# Patient Record
Sex: Male | Born: 1985 | Race: Black or African American | Hispanic: No | Marital: Married | Smoking: Never smoker
Health system: Southern US, Community
[De-identification: ages and names within clinical notes are randomized; demographics above are authoritative.]

---

## 2019-09-13 ENCOUNTER — Emergency Department (HOSPITAL_COMMUNITY)
Admission: EM | Admit: 2019-09-13 | Discharge: 2019-09-14 | Disposition: A | Discharge status: Home / Self Care | Payer: Self-pay | Attending: Emergency Medicine | Admitting: Emergency Medicine

## 2019-09-13 ENCOUNTER — Encounter (HOSPITAL_COMMUNITY): Payer: Self-pay

## 2019-09-13 ENCOUNTER — Other Ambulatory Visit: Payer: Self-pay

## 2019-09-13 DIAGNOSIS — Y929 Unspecified place or not applicable: Secondary | ICD-10-CM | POA: Insufficient documentation

## 2019-09-13 DIAGNOSIS — Y939 Activity, unspecified: Secondary | ICD-10-CM | POA: Insufficient documentation

## 2019-09-13 DIAGNOSIS — S0083XA Contusion of other part of head, initial encounter: Secondary | ICD-10-CM | POA: Insufficient documentation

## 2019-09-13 DIAGNOSIS — Y999 Unspecified external cause status: Secondary | ICD-10-CM | POA: Insufficient documentation
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
## 2019-09-14 ENCOUNTER — Emergency Department (HOSPITAL_COMMUNITY): Payer: Self-pay

## 2019-09-14 MED ORDER — IBUPROFEN 800 MG PO TABS
800.0000 mg | ORAL_TABLET | Freq: Once | ORAL | Status: DC
  Filled 2019-09-14: qty 1
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
## 2019-09-14 NOTE — Discharge Instructions (Addendum)
1. Medications: ibuprofen for pain, usual home medications 2. Treatment: rest, drink plenty of fluids,  3. Follow Up: Please followup with your primary doctor in 2-3 days for discussion of your diagnoses and further evaluation after today's visit; if you do not have a primary care doctor use the resource guide provided to find one; Please return to the ER for new or worsening symptoms.

## 2019-10-18 ENCOUNTER — Encounter (HOSPITAL_COMMUNITY): Payer: Self-pay | Admitting: Emergency Medicine

## 2019-10-18 ENCOUNTER — Inpatient Hospital Stay (HOSPITAL_COMMUNITY)
Admission: EM | Admit: 2019-10-18 | Discharge: 2019-10-21 | DRG: 917 | Disposition: A | Discharge status: Home / Self Care | Payer: Self-pay | Attending: Internal Medicine | Admitting: Internal Medicine

## 2019-10-18 ENCOUNTER — Other Ambulatory Visit: Payer: Self-pay

## 2019-10-18 ENCOUNTER — Emergency Department (HOSPITAL_COMMUNITY): Payer: Self-pay

## 2019-10-18 DIAGNOSIS — F1490 Cocaine use, unspecified, uncomplicated: Secondary | ICD-10-CM | POA: Diagnosis present

## 2019-10-18 DIAGNOSIS — T50901A Poisoning by unspecified drugs, medicaments and biological substances, accidental (unintentional), initial encounter: Principal | ICD-10-CM | POA: Diagnosis present

## 2019-10-18 DIAGNOSIS — N179 Acute kidney failure, unspecified: Secondary | ICD-10-CM | POA: Diagnosis present

## 2019-10-18 DIAGNOSIS — J9600 Acute respiratory failure, unspecified whether with hypoxia or hypercapnia: Secondary | ICD-10-CM | POA: Diagnosis present

## 2019-10-18 DIAGNOSIS — F10120 Alcohol abuse with intoxication, uncomplicated: Secondary | ICD-10-CM | POA: Diagnosis present

## 2019-10-18 DIAGNOSIS — F10920 Alcohol use, unspecified with intoxication, uncomplicated: Secondary | ICD-10-CM | POA: Diagnosis present

## 2019-10-18 DIAGNOSIS — R4182 Altered mental status, unspecified: Secondary | ICD-10-CM

## 2019-10-18 DIAGNOSIS — Y906 Blood alcohol level of 120-199 mg/100 ml: Secondary | ICD-10-CM | POA: Diagnosis present

## 2019-10-18 DIAGNOSIS — J9601 Acute respiratory failure with hypoxia: Secondary | ICD-10-CM | POA: Diagnosis present

## 2019-10-18 DIAGNOSIS — R519 Headache, unspecified: Secondary | ICD-10-CM | POA: Diagnosis not present

## 2019-10-18 DIAGNOSIS — Z20822 Contact with and (suspected) exposure to covid-19: Secondary | ICD-10-CM | POA: Diagnosis present

## 2019-10-18 DIAGNOSIS — R0902 Hypoxemia: Secondary | ICD-10-CM

## 2019-10-18 LAB — CBC WITH DIFFERENTIAL/PLATELET
Abs Immature Granulocytes: 0.36 10*3/uL — ABNORMAL HIGH (ref 0.00–0.07)
Basophils Absolute: 0.1 10*3/uL (ref 0.0–0.1)
Basophils Relative: 0 %
Eosinophils Absolute: 0 10*3/uL (ref 0.0–0.5)
Eosinophils Relative: 0 %
HCT: 45.1 % (ref 39.0–52.0)
Hemoglobin: 14.3 g/dL (ref 13.0–17.0)
Immature Granulocytes: 2 %
Lymphocytes Relative: 8 %
Lymphs Abs: 1.3 10*3/uL (ref 0.7–4.0)
MCH: 32.1 pg (ref 26.0–34.0)
MCHC: 31.7 g/dL (ref 30.0–36.0)
MCV: 101.1 fL — ABNORMAL HIGH (ref 80.0–100.0)
Monocytes Absolute: 0.6 10*3/uL (ref 0.1–1.0)
Monocytes Relative: 4 %
Neutro Abs: 13.9 10*3/uL — ABNORMAL HIGH (ref 1.7–7.7)
Neutrophils Relative %: 86 %
Platelets: 214 10*3/uL (ref 150–400)
RBC: 4.46 MIL/uL (ref 4.22–5.81)
RDW: 14.6 % (ref 11.5–15.5)
WBC: 16.1 10*3/uL — ABNORMAL HIGH (ref 4.0–10.5)
nRBC: 0 % (ref 0.0–0.2)

## 2019-10-18 LAB — BASIC METABOLIC PANEL
Anion gap: 20 — ABNORMAL HIGH (ref 5–15)
BUN: 11 mg/dL (ref 6–20)
CO2: 16 mmol/L — ABNORMAL LOW (ref 22–32)
Calcium: 8.7 mg/dL — ABNORMAL LOW (ref 8.9–10.3)
Chloride: 103 mmol/L (ref 98–111)
Creatinine, Ser: 1.88 mg/dL — ABNORMAL HIGH (ref 0.61–1.24)
GFR calc Af Amer: 53 mL/min — ABNORMAL LOW (ref >60)
GFR calc non Af Amer: 46 mL/min — ABNORMAL LOW (ref >60)
Glucose, Bld: 193 mg/dL — ABNORMAL HIGH (ref 70–99)
Potassium: 3.8 mmol/L (ref 3.5–5.1)
Sodium: 139 mmol/L (ref 135–145)

## 2019-10-18 LAB — ETHANOL: Alcohol, Ethyl (B): 179 mg/dL — ABNORMAL HIGH (ref <10)

## 2019-10-18 MED ORDER — SODIUM CHLORIDE 0.9 % IV BOLUS
1000.0000 mL | Freq: Once | INTRAVENOUS | Status: AC
  Administered 2019-10-18: 1000 mL via INTRAVENOUS
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
## 2019-10-18 NOTE — ED Provider Notes (Signed)
[facility]   CSN: [id number] Arrival date & time: 10/18/19  1854     History Chief Complaint  Patient presents with  . Drug Overdose  . Altered Mental Status    Reinaldo Raddleerrance Goines is a 34 y.o. male.  Level 5 caveat secondary to altered mental status.  He was brought in by EMS after becoming unresponsive while in the bathroom at a friend's house.  Possible seizure activity.  EMS found him with a good blood sugar.  Small pupils and low respiratory rate so gave 2 mg of Narcan twice with improvement in mental status.  Patient still somnolent here.  Not answering questions.  The history is provided by the patient and the EMS personnel. The history is limited by the condition of the patient.  Altered Mental Status Presenting symptoms: partial responsiveness   Severity:  Moderate Most recent episode:  Today Episode history:  Single Timing:  Unable to specify Progression:  Partially resolved Chronicity:  New Context: drug use (?)        No past medical history on file.  There are no problems to display for this patient.   History reviewed. No pertinent surgical history.     No family history on file.  Social History   Tobacco Use  . Smoking status: Never Smoker  . Smokeless tobacco: Never Used  Substance Use Topics  . Alcohol use: Yes  . Drug use: Yes    Types: Cocaine    Home Medications Prior to Admission medications   Not on File    Allergies    Patient has no known allergies.  Review of Systems   Review of Systems  Unable to perform ROS: Mental status change    Physical Exam Updated Vital Signs BP 137/86 (BP Location: Left Arm)   Pulse (!) 108   Temp 97.9 F (36.6 C) (Oral)   Resp 17   SpO2 98%   Physical Exam Vitals and nursing note reviewed.  Constitutional:      General: He is not in acute distress.    Appearance: Normal appearance. He is well-developed.  HENT:     Head: Normocephalic  and atraumatic.  Eyes:     Conjunctiva/sclera: Conjunctivae normal.  Neck:     Comments: In cervical collar trach midline Cardiovascular:     Rate and Rhythm: Normal rate and regular rhythm.     Heart sounds: No murmur.  Pulmonary:     Effort: Pulmonary effort is normal. No respiratory distress.     Breath sounds: Normal breath sounds.  Abdominal:     Palpations: Abdomen is soft.     Tenderness: There is no abdominal tenderness.  Musculoskeletal:        General: No deformity or signs of injury. Normal range of motion.  Skin:    General: Skin is warm and dry.     Capillary Refill: Capillary refill takes less than 2 seconds.  Neurological:     Mental Status: He is lethargic.     Comments: Patient is arousable to painful stimuli.  Opens eyes.  Not answering questions.  Not following commands.  Moving all extremities to painful stimuli.  GCS currently 8     ED Results / Procedures / Treatments   Labs (all labs ordered are listed, but only abnormal results are displayed) Labs Reviewed  BASIC METABOLIC PANEL - Abnormal; Notable for the following components:      Result Value   CO2 16 (*)  Glucose, Bld 193 (*)    Creatinine, Ser 1.88 (*)    Calcium 8.7 (*)    GFR calc non Af Amer 46 (*)    GFR calc Af Amer 53 (*)    Anion gap 20 (*)    All other components within normal limits  CBC WITH DIFFERENTIAL/PLATELET - Abnormal; Notable for the following components:   WBC 16.1 (*)    MCV 101.1 (*)    Neutro Abs 13.9 (*)    Abs Immature Granulocytes 0.36 (*)    All other components within normal limits  ETHANOL - Abnormal; Notable for the following components:   Alcohol, Ethyl (B) 179 (*)    All other components within normal limits  RAPID URINE DRUG SCREEN, HOSP PERFORMED - Abnormal; Notable for the following components:   Cocaine POSITIVE (*)    All other components within normal limits  D-DIMER, QUANTITATIVE (NOT AT [facility]) - Abnormal; Notable for the following components:    D-Dimer, Quant 0.51 (*)    All other components within normal limits  URINALYSIS, COMPLETE (UACMP) WITH MICROSCOPIC - Abnormal; Notable for the following components:   Ketones, ur 20 (*)    Protein, ur 30 (*)    All other components within normal limits  POCT I-STAT 7, (LYTES, BLD GAS, ICA,H+H) - Abnormal; Notable for the following components:   pH, Arterial 7.341 (*)    pO2, Arterial 62 (*)    All other components within normal limits  SARS CORONAVIRUS 2 BY RT PCR (HOSPITAL ORDER, PERFORMED IN [facility])  CULTURE, BLOOD (ROUTINE X 2)  CULTURE, BLOOD (ROUTINE X 2)  STREP PNEUMONIAE URINARY ANTIGEN  CK  HIV ANTIBODY (ROUTINE TESTING W REFLEX)  LEGIONELLA PNEUMOPHILA SEROGP 1 UR AG  I-STAT ARTERIAL BLOOD GAS, ED    EKG EKG Interpretation  Date/Time:  Wednesday Oct 18 2019 19:02:04 EDT Ventricular Rate:  112 PR Interval:    QRS Duration: 98 QT Interval:  359 QTC Calculation: 490 R Axis:   92 Text Interpretation: Sinus tachycardia Biatrial enlargement Borderline right axis deviation Borderline T wave abnormalities Prolonged QT interval No old tracing to compare Confirmed by Meridee Score [phone]) on 10/18/2019 7:04:17 PM Also confirmed by Meridee Score [phone]), editor Erenest Rasher ([id number])  on 10/19/2019 7:57:41 AM   Radiology CT Head Wo Contrast  Result Date: 10/18/2019 CLINICAL DATA:  Suspected drug overdose. Found unconscious. EXAM: CT HEAD WITHOUT CONTRAST CT CERVICAL SPINE WITHOUT CONTRAST TECHNIQUE: Multidetector CT imaging of the head and cervical spine was performed following the standard protocol without intravenous contrast. Multiplanar CT image reconstructions of the cervical spine were also generated. COMPARISON:  None. FINDINGS: CT HEAD FINDINGS Brain: There is no mass, hemorrhage or extra-axial collection. The size and configuration of the ventricles and extra-axial CSF spaces are normal. The brain parenchyma is normal, without evidence of acute or chronic  infarction. Vascular: No abnormal hyperdensity of the major intracranial arteries or dural venous sinuses. No intracranial atherosclerosis. Skull: The visualized skull base, calvarium and extracranial soft tissues are normal. Sinuses/Orbits: No fluid levels or advanced mucosal thickening of the visualized paranasal sinuses. No mastoid or middle ear effusion. The orbits are normal. CT CERVICAL SPINE FINDINGS Alignment: No static subluxation. Facets are aligned. Occipital condyles are normally positioned. Skull base and vertebrae: No acute fracture. C5-6 fusion. Soft tissues and spinal canal: No prevertebral fluid or swelling. No visible canal hematoma. Disc levels: No advanced spinal canal or neural foraminal stenosis. Upper chest: No pneumothorax, pulmonary nodule or pleural  effusion. Other: Normal visualized paraspinal cervical soft tissues. IMPRESSION: 1. No acute intracranial abnormality. 2. No acute fracture or static subluxation of the cervical spine. Electronically Signed   By: Ulyses Jarred M.D.   On: 10/18/2019 21:08   CT ANGIO CHEST PE W OR WO CONTRAST  Result Date: 10/19/2019 CLINICAL DATA:  Shortness of breath and lethargy EXAM: CT ANGIOGRAPHY CHEST WITH CONTRAST TECHNIQUE: Multidetector CT imaging of the chest was performed using the standard protocol during bolus administration of intravenous contrast. Multiplanar CT image reconstructions and MIPs were obtained to evaluate the vascular anatomy. CONTRAST:  85mL OMNIPAQUE IOHEXOL 350 MG/ML SOLN COMPARISON:  Chest radiograph Oct 18, 2019 FINDINGS: Cardiovascular: There is no demonstrable pulmonary embolus. There is no thoracic aortic aneurysm or dissection. Visualized great vessels appear normal. No appreciable pericardial effusion or pericardial thickening. Mediastinum/Nodes: Thyroid appears unremarkable. There is no appreciable thoracic adenopathy. Moderate air is noted in the esophagus. No focal esophageal lesions are evident by CT. Lungs/Pleura:  There is airspace opacity with consolidation throughout much of the left lower lobe. More patchy infiltrate is noted in portions of the posterior and superior segments of the right lower lobe. There is ill-defined patchy opacity in the posterior segment right upper lobe. No appreciable pleural effusions are evident. Upper Abdomen: Visualized upper abdominal structures appear unremarkable. Musculoskeletal: No blastic or lytic bone lesions. No chest wall lesions evident. Review of the MIP images confirms the above findings. IMPRESSION: 1. No demonstrable pulmonary embolus. No thoracic aortic aneurysm or dissection. 2. Multifocal pneumonia with the greatest degree of airspace opacity in the left lower lobe. Areas of airspace opacity also noted in the right lower lobe and posterior segment right upper lobe. Correlation with COVID-19 status may be advisable given these findings. 3.  No evident adenopathy. Electronically Signed   By: Lowella Grip III M.D.   On: 10/19/2019 07:59   CT Cervical Spine Wo Contrast  Result Date: 10/18/2019 CLINICAL DATA:  Suspected drug overdose. Found unconscious. EXAM: CT HEAD WITHOUT CONTRAST CT CERVICAL SPINE WITHOUT CONTRAST TECHNIQUE: Multidetector CT imaging of the head and cervical spine was performed following the standard protocol without intravenous contrast. Multiplanar CT image reconstructions of the cervical spine were also generated. COMPARISON:  None. FINDINGS: CT HEAD FINDINGS Brain: There is no mass, hemorrhage or extra-axial collection. The size and configuration of the ventricles and extra-axial CSF spaces are normal. The brain parenchyma is normal, without evidence of acute or chronic infarction. Vascular: No abnormal hyperdensity of the major intracranial arteries or dural venous sinuses. No intracranial atherosclerosis. Skull: The visualized skull base, calvarium and extracranial soft tissues are normal. Sinuses/Orbits: No fluid levels or advanced mucosal  thickening of the visualized paranasal sinuses. No mastoid or middle ear effusion. The orbits are normal. CT CERVICAL SPINE FINDINGS Alignment: No static subluxation. Facets are aligned. Occipital condyles are normally positioned. Skull base and vertebrae: No acute fracture. C5-6 fusion. Soft tissues and spinal canal: No prevertebral fluid or swelling. No visible canal hematoma. Disc levels: No advanced spinal canal or neural foraminal stenosis. Upper chest: No pneumothorax, pulmonary nodule or pleural effusion. Other: Normal visualized paraspinal cervical soft tissues. IMPRESSION: 1. No acute intracranial abnormality. 2. No acute fracture or static subluxation of the cervical spine. Electronically Signed   By: Ulyses Jarred M.D.   On: 10/18/2019 21:08   DG Chest Port 1 View  Result Date: 10/18/2019 CLINICAL DATA:  Altered EXAM: PORTABLE CHEST 1 VIEW COMPARISON:  None. FINDINGS: The heart size and mediastinal contours are  within normal limits. Both lungs are clear. The visualized skeletal structures are unremarkable. IMPRESSION: No active disease. Electronically Signed   By: Jasmine Pang M.D.   On: 10/18/2019 19:21    Procedures .Critical Care Performed by: Terrilee Files, MD Authorized by: Terrilee Files, MD   Critical care provider statement:    Critical care time (minutes):  45   Critical care time was exclusive of:  Separately billable procedures and treating other patients   Critical care was necessary to treat or prevent imminent or life-threatening deterioration of the following conditions:  CNS failure or compromise and respiratory failure   Critical care was time spent personally by me on the following activities:  Evaluation of patient's response to treatment, examination of patient, ordering and performing treatments and interventions, ordering and review of laboratory studies, ordering and review of radiographic studies, pulse oximetry, re-evaluation of patient's condition,  obtaining history from patient or surrogate, review of old charts and development of treatment plan with patient or surrogate   I assumed direction of critical care for this patient from another provider in my specialty: no     (including critical care time)  Medications Ordered in ED Medications  lactated ringers bolus 1,000 mL (1,000 mLs Intravenous New Bag/Given 10/19/19 8676)    Followed by  lactated ringers infusion (has no administration in time range)  cefTRIAXone (ROCEPHIN) 2 g in sodium chloride 0.9 % 100 mL IVPB (0 g Intravenous Stopped 10/19/19 0701)  azithromycin (ZITHROMAX) 500 mg in sodium chloride 0.9 % 250 mL IVPB (0 mg Intravenous Stopped 10/19/19 0731)  polyethylene glycol (MIRALAX / GLYCOLAX) packet 17 g (has no administration in time range)  ondansetron (ZOFRAN) tablet 4 mg (has no administration in time range)    Or  ondansetron (ZOFRAN) injection 4 mg (has no administration in time range)  enoxaparin (LOVENOX) injection 40 mg (has no administration in time range)  acetaminophen (TYLENOL) tablet 650 mg (has no administration in time range)    Or  acetaminophen (TYLENOL) suppository 650 mg (has no administration in time range)  sodium chloride 0.9 % bolus 1,000 mL (0 mLs Intravenous Stopped 10/19/19 0221)  iohexol (OMNIPAQUE) 350 MG/ML injection 60 mL (60 mLs Intravenous Contrast Given 10/19/19 1950)    ED Course  I have reviewed the triage vital signs and the nursing notes.  Pertinent labs & imaging results that were available during my care of the patient were reviewed by me and considered in my medical decision making (see chart for details).  Clinical Course as of Oct 18 924  Wed Oct 18, 2019  2020 Reevaluated patient, he is more easily arousable.  Still with some slurred speech and not answering questions.  Lab work showing elevated creatinine of 1.88 and elevated glucose with no priors to compare with.  Does have an anion gap.  Elevated white count of 16.   [MB]      Clinical Course User Index [MB] Terrilee Files, MD   MDM Rules/Calculators/A&P                     This patient complains of altered mental status; this involves an extensive number of treatment Options and is a complaint that carries with it a high risk of complications and Morbidity. The differential includes intoxication, seizure, bleed, metabolic derangement, infection  I ordered, reviewed and interpreted labs, which included CBC with elevated white count, chemistries with elevated creatinine and low bicarb likely reflecting some element of dehydration, unclear  baseline.  Covid testing negative I ordered medication IV fluids I ordered imaging studies which included chest x-ray head and cervical spine CT and I independently    visualized and interpreted imaging which showed no acute findings Additional history obtained from from EMS  Critical Interventions: Evaluation of altered mental status and serial neurologic exams  After the interventions stated above, I reevaluated the patient and found patient's mental status to be improving but clearly not back to baseline.  He is also requiring oxygen.  Desats off oxygen.  Chest x-ray negative.  Signed out to Dr. Wilkie Aye with plan to for serial neuro exams and if can wean off of oxygen back to baseline can be discharged.  Otherwise will need admission to the hospital for further management.   Final Clinical Impression(s) / ED Diagnoses Final diagnoses:  Altered mental status, unspecified altered mental status type  AKI (acute kidney injury) (HCC)  Hypoxia    Rx / DC Orders ED Discharge Orders    None       Terrilee Files, MD 10/19/19 [phone]

## 2019-10-18 NOTE — ED Notes (Signed)
Pt titrated to [place] at flow rate of 4 lpm, maintaining SPO2 97% at this time.

## 2019-10-18 NOTE — ED Triage Notes (Signed)
Patient arrives via ems from friends house after having a suspected overdose. Per ems, the patient's friend's heard a "thud" in the bathroom and found the patient unconscious in the floor. The people on the scene did not give much info to ems.   Patient was given 2mg  IV narcan en route and regained minimal consciousness. Patient became lethargic again and received another 2mg  IV narcan.   Patient arrives lethargic and only arousing to loud speech and sternal rubs.   20g IV to the R. AC

## 2019-10-18 NOTE — ED Notes (Signed)
Jon Gills sister [phone] looking for an update on pt

## 2019-10-19 ENCOUNTER — Observation Stay (HOSPITAL_COMMUNITY): Payer: Self-pay

## 2019-10-19 ENCOUNTER — Observation Stay (HOSPITAL_BASED_OUTPATIENT_CLINIC_OR_DEPARTMENT_OTHER): Payer: Self-pay

## 2019-10-19 ENCOUNTER — Encounter (HOSPITAL_COMMUNITY): Payer: Self-pay | Admitting: Internal Medicine

## 2019-10-19 DIAGNOSIS — R55 Syncope and collapse: Secondary | ICD-10-CM

## 2019-10-19 DIAGNOSIS — N179 Acute kidney failure, unspecified: Secondary | ICD-10-CM | POA: Diagnosis present

## 2019-10-19 DIAGNOSIS — F10920 Alcohol use, unspecified with intoxication, uncomplicated: Secondary | ICD-10-CM | POA: Diagnosis present

## 2019-10-19 DIAGNOSIS — J9601 Acute respiratory failure with hypoxia: Secondary | ICD-10-CM

## 2019-10-19 DIAGNOSIS — F1490 Cocaine use, unspecified, uncomplicated: Secondary | ICD-10-CM | POA: Diagnosis present

## 2019-10-19 LAB — CK: Total CK: 376 U/L (ref 49–397)

## 2019-10-19 LAB — COMPREHENSIVE METABOLIC PANEL
ALT: 49 U/L — ABNORMAL HIGH (ref 0–44)
AST: 66 U/L — ABNORMAL HIGH (ref 15–41)
Albumin: 4.3 g/dL (ref 3.5–5.0)
Alkaline Phosphatase: 63 U/L (ref 38–126)
Anion gap: 12 (ref 5–15)
BUN: 10 mg/dL (ref 6–20)
CO2: 23 mmol/L (ref 22–32)
Calcium: 8.6 mg/dL — ABNORMAL LOW (ref 8.9–10.3)
Chloride: 103 mmol/L (ref 98–111)
Creatinine, Ser: 0.9 mg/dL (ref 0.61–1.24)
GFR calc Af Amer: >60 mL/min (ref >60)
GFR calc non Af Amer: >60 mL/min (ref >60)
Glucose, Bld: 113 mg/dL — ABNORMAL HIGH (ref 70–99)
Potassium: 4.4 mmol/L (ref 3.5–5.1)
Sodium: 138 mmol/L (ref 135–145)
Total Bilirubin: 1.3 mg/dL — ABNORMAL HIGH (ref 0.3–1.2)
Total Protein: 7.3 g/dL (ref 6.5–8.1)

## 2019-10-19 LAB — RAPID URINE DRUG SCREEN, HOSP PERFORMED
Amphetamines: NOT DETECTED
Barbiturates: NOT DETECTED
Benzodiazepines: NOT DETECTED
Cocaine: POSITIVE — AB
Opiates: NOT DETECTED
Tetrahydrocannabinol: NOT DETECTED

## 2019-10-19 LAB — CBC
HCT: 40.2 % (ref 39.0–52.0)
Hemoglobin: 12.8 g/dL — ABNORMAL LOW (ref 13.0–17.0)
MCH: 32.1 pg (ref 26.0–34.0)
MCHC: 31.8 g/dL (ref 30.0–36.0)
MCV: 100.8 fL — ABNORMAL HIGH (ref 80.0–100.0)
Platelets: 220 10*3/uL (ref 150–400)
RBC: 3.99 MIL/uL — ABNORMAL LOW (ref 4.22–5.81)
RDW: 14.6 % (ref 11.5–15.5)
WBC: 17.9 10*3/uL — ABNORMAL HIGH (ref 4.0–10.5)
nRBC: 0 % (ref 0.0–0.2)

## 2019-10-19 LAB — URINALYSIS, COMPLETE (UACMP) WITH MICROSCOPIC
Bacteria, UA: NONE SEEN
Bilirubin Urine: NEGATIVE
Glucose, UA: NEGATIVE mg/dL
Hgb urine dipstick: NEGATIVE
Ketones, ur: 20 mg/dL — AB
Leukocytes,Ua: NEGATIVE
Nitrite: NEGATIVE
Protein, ur: 30 mg/dL — AB
Specific Gravity, Urine: 1.013 (ref 1.005–1.030)
pH: 5 (ref 5.0–8.0)

## 2019-10-19 LAB — PHOSPHORUS: Phosphorus: 3.1 mg/dL (ref 2.5–4.6)

## 2019-10-19 LAB — HIV ANTIBODY (ROUTINE TESTING W REFLEX): HIV Screen 4th Generation wRfx: NONREACTIVE

## 2019-10-19 LAB — POCT I-STAT 7, (LYTES, BLD GAS, ICA,H+H)
Acid-base deficit: 1 mmol/L (ref 0.0–2.0)
Bicarbonate: 25 mmol/L (ref 20.0–28.0)
Calcium, Ion: 1.19 mmol/L (ref 1.15–1.40)
HCT: 41 % (ref 39.0–52.0)
Hemoglobin: 13.9 g/dL (ref 13.0–17.0)
O2 Saturation: 90 %
Potassium: 4.4 mmol/L (ref 3.5–5.1)
Sodium: 135 mmol/L (ref 135–145)
TCO2: 26 mmol/L (ref 22–32)
pCO2 arterial: 46.2 mmHg (ref 32.0–48.0)
pH, Arterial: 7.341 — ABNORMAL LOW (ref 7.350–7.450)
pO2, Arterial: 62 mmHg — ABNORMAL LOW (ref 83.0–108.0)

## 2019-10-19 LAB — MAGNESIUM: Magnesium: 1.7 mg/dL (ref 1.7–2.4)

## 2019-10-19 LAB — ECHOCARDIOGRAM COMPLETE

## 2019-10-19 LAB — D-DIMER, QUANTITATIVE: D-Dimer, Quant: 0.51 ug/mL-FEU — ABNORMAL HIGH (ref 0.00–0.50)

## 2019-10-19 LAB — STREP PNEUMONIAE URINARY ANTIGEN: Strep Pneumo Urinary Antigen: NEGATIVE

## 2019-10-19 LAB — SARS CORONAVIRUS 2 BY RT PCR (HOSPITAL ORDER, PERFORMED IN ~~LOC~~ HOSPITAL LAB): SARS Coronavirus 2: NEGATIVE

## 2019-10-19 MED ORDER — THIAMINE HCL 100 MG PO TABS
100.0000 mg | ORAL_TABLET | Freq: Every day | ORAL | Status: DC
  Administered 2019-10-19 – 2019-10-21 (×3): 100 mg via ORAL
  Filled 2019-10-19 (×3): qty 1

## 2019-10-19 MED ORDER — LACTATED RINGERS IV SOLN: INTRAVENOUS | Status: AC

## 2019-10-19 MED ORDER — LORAZEPAM 2 MG/ML IJ SOLN: 1.0000 mg | INTRAMUSCULAR | Status: DC | PRN

## 2019-10-19 MED ORDER — FOLIC ACID 1 MG PO TABS
1.0000 mg | ORAL_TABLET | Freq: Every day | ORAL | Status: DC
  Administered 2019-10-19 – 2019-10-21 (×3): 1 mg via ORAL
  Filled 2019-10-19 (×3): qty 1

## 2019-10-19 MED ORDER — ONDANSETRON HCL 4 MG PO TABS: 4.0000 mg | ORAL_TABLET | Freq: Four times a day (QID) | ORAL | Status: DC | PRN

## 2019-10-19 MED ORDER — ACETAMINOPHEN 325 MG PO TABS
650.0000 mg | ORAL_TABLET | Freq: Four times a day (QID) | ORAL | Status: DC | PRN
  Administered 2019-10-20 (×2): 650 mg via ORAL
  Filled 2019-10-19 (×3): qty 2

## 2019-10-19 MED ORDER — ADULT MULTIVITAMIN W/MINERALS CH
1.0000 | ORAL_TABLET | Freq: Every day | ORAL | Status: DC
  Administered 2019-10-19 – 2019-10-21 (×3): 1 via ORAL
  Filled 2019-10-19 (×3): qty 1

## 2019-10-19 MED ORDER — SODIUM CHLORIDE 0.9 % IV SOLN
500.0000 mg | Freq: Every day | INTRAVENOUS | Status: DC
  Administered 2019-10-19 – 2019-10-21 (×3): 500 mg via INTRAVENOUS
  Filled 2019-10-19 (×2): qty 500

## 2019-10-19 MED ORDER — THIAMINE HCL 100 MG/ML IJ SOLN: 100.0000 mg | Freq: Every day | INTRAMUSCULAR | Status: DC

## 2019-10-19 MED ORDER — ONDANSETRON HCL 4 MG/2ML IJ SOLN: 4.0000 mg | Freq: Four times a day (QID) | INTRAMUSCULAR | Status: DC | PRN

## 2019-10-19 MED ORDER — LACTATED RINGERS IV BOLUS
1000.0000 mL | Freq: Once | INTRAVENOUS | Status: AC
  Administered 2019-10-19: 1000 mL via INTRAVENOUS

## 2019-10-19 MED ORDER — LORAZEPAM 1 MG PO TABS
1.0000 mg | ORAL_TABLET | ORAL | Status: DC | PRN
  Administered 2019-10-19: 1 mg via ORAL
  Filled 2019-10-19: qty 1

## 2019-10-19 MED ORDER — ACETAMINOPHEN 650 MG RE SUPP: 650.0000 mg | Freq: Four times a day (QID) | RECTAL | Status: DC | PRN

## 2019-10-19 MED ORDER — POLYETHYLENE GLYCOL 3350 17 G PO PACK: 17.0000 g | PACK | Freq: Every day | ORAL | Status: DC | PRN

## 2019-10-19 MED ORDER — SODIUM CHLORIDE 0.9 % IV SOLN
2.0000 g | Freq: Every day | INTRAVENOUS | Status: DC
  Administered 2019-10-19 – 2019-10-21 (×3): 2 g via INTRAVENOUS
  Filled 2019-10-19 (×3): qty 20

## 2019-10-19 MED ORDER — ENOXAPARIN SODIUM 40 MG/0.4ML ~~LOC~~ SOLN
40.0000 mg | Freq: Every day | SUBCUTANEOUS | Status: DC
  Administered 2019-10-19 – 2019-10-20 (×2): 40 mg via SUBCUTANEOUS
  Filled 2019-10-19 (×2): qty 0.4

## 2019-10-19 MED ORDER — IOHEXOL 350 MG/ML SOLN
60.0000 mL | Freq: Once | INTRAVENOUS | Status: AC | PRN
  Administered 2019-10-19: 60 mL via INTRAVENOUS

## 2019-10-19 NOTE — ED Notes (Signed)
Walked patient to the bathroom patient did well 

## 2019-10-19 NOTE — H&P (Signed)
History and Physical    Kenneth Mccann LKG:[id number] DOB: 1986/05/16 DOA: 10/18/2019  PCP: Patient, No Pcp Per  Patient coming from: Home   Chief Complaint:  Chief Complaint  Patient presents with  . Drug Overdose  . Altered Mental Status     HPI:    34 year old male with no past medical history presents to [facility] emergency department brought in by ambulance after being found lying face down in his bathroom by a friend.  Patient is a poor historian secondary to lethargy secondary to polysubstance abuse.  Patient states that he was drinking alcohol yesterday, reporting drinking at least two 40 ounce bottles of beer but is unsure.  Patient also endorses cocaine use but is uncertain as to how much.  Patient denies any symptoms at the ordinary prior to his binge drinking and substance use.  Patient denies chest pain, shortness of breath, cough, fevers, nausea, vomiting, abdominal pain, diarrhea, recent travel, sick contacts or confirmed contact with COVID-19.  Upon evaluation in the emergency department patient was initially found to be extremely lethargic and minimally responsive feel to be secondary to polysubstance use.  Serum alcohol level found to be 179 with elevated creatinine of 1.8 and leukocytosis of 16.1.  Patient was also found to be exhibiting an oxygen requirement requiring up to 4 L of oxygen via nasal cannula and exhibiting several bouts of hypoxia whenever attempting to wean the patient.  Due to persisting hypoxemia and polysubstance use the hospitalist group was called to assess the patient for admission the hospital.     Review of Systems: Unable to perform due to significant lethargy from polysubstance use.  History reviewed. No pertinent past medical history.  History reviewed. No pertinent surgical history.   reports that he has never smoked. He has never used smokeless tobacco. He reports current alcohol use. He reports current drug use. Drug:  Cocaine.  No Known Allergies  Family History  Family history unknown: Yes     Prior to Admission medications   Not on File    Physical Exam: Vitals:   10/19/19 0345 10/19/19 0430 10/19/19 0500 10/19/19 0530  BP: (!) 151/96 (!) 142/99 (!) 146/95 (!) 146/94  Pulse: 93 96 (!) 101 100  Resp: 15 11 12 13   Temp:      TempSrc:      SpO2: 94% 95% 91% 94%    Constitutional: Lethargic arousable and oriented x3, patient is not in acute distress. Skin: no rashes, no lesions, extremely poor skin turgor noted.  Dry skin. Eyes: Pupils are equally reactive to light.  No evidence of scleral icterus or conjunctival pallor.  ENMT: Notably dry mucous membranes.  Posterior pharynx clear of any exudate or lesions.   Neck: normal, supple, no masses, no thyromegaly.  No evidence of jugular venous distension.   Respiratory: Significant rales noted in the right base.  No evidence of wheezing.  Normal respiratory effort. No accessory muscle use.  Cardiovascular: Tachycardic rate with regular rhythm.  No murmurs / rubs / gallops. No extremity edema. 2+ pedal pulses. No carotid bruits.  Chest:   Nontender without crepitus or deformity.   Back:   Nontender without crepitus or deformity. Abdomen: Abdomen is soft and nontender.  No evidence of intra-abdominal masses.  Positive bowel sounds noted in all quadrants.   Musculoskeletal: No joint deformity upper and lower extremities. Good ROM, no contractures. Normal muscle tone.  Neurologic: CN 2-12 grossly intact. Sensation intact, strength noted to be 5 out of 5  in all 4 extremities.  Patient is following all commands.  Patient is responsive to verbal stimuli.   Psychiatric: Patient exhibiting a depressed mood with a flat affect.  Patient seems to possess insight as to theircurrent situation.     Labs on Admission: I have personally reviewed following labs and imaging studies -   CBC: Recent Labs  Lab 10/18/19 1910  WBC 16.1*  NEUTROABS 13.9*  HGB 14.3    HCT 45.1  MCV 101.1*  PLT 214   Basic Metabolic Panel: Recent Labs  Lab 10/18/19 1910  NA 139  K 3.8  CL 103  CO2 16*  GLUCOSE 193*  BUN 11  CREATININE 1.88*  CALCIUM 8.7*   GFR: CrCl cannot be calculated (Unknown ideal weight.). Liver Function Tests: No results for input(s): AST, ALT, ALKPHOS, BILITOT, PROT, ALBUMIN in the last 168 hours. No results for input(s): LIPASE, AMYLASE in the last 168 hours. No results for input(s): AMMONIA in the last 168 hours. Coagulation Profile: No results for input(s): INR, PROTIME in the last 168 hours. Cardiac Enzymes: No results for input(s): CKTOTAL, CKMB, CKMBINDEX, TROPONINI in the last 168 hours. BNP (last 3 results) No results for input(s): PROBNP in the last 8760 hours. HbA1C: No results for input(s): HGBA1C in the last 72 hours. CBG: No results for input(s): GLUCAP in the last 168 hours. Lipid Profile: No results for input(s): CHOL, HDL, LDLCALC, TRIG, CHOLHDL, LDLDIRECT in the last 72 hours. Thyroid Function Tests: No results for input(s): TSH, T4TOTAL, FREET4, T3FREE, THYROIDAB in the last 72 hours. Anemia Panel: No results for input(s): VITAMINB12, FOLATE, FERRITIN, TIBC, IRON, RETICCTPCT in the last 72 hours. Urine analysis: No results found for: COLORURINE, APPEARANCEUR, LABSPEC, PHURINE, GLUCOSEU, HGBUR, BILIRUBINUR, KETONESUR, PROTEINUR, UROBILINOGEN, NITRITE, LEUKOCYTESUR  Radiological Exams on Admission - Personally Reviewed: CT Head Wo Contrast  Result Date: 10/18/2019 CLINICAL DATA:  Suspected drug overdose. Found unconscious. EXAM: CT HEAD WITHOUT CONTRAST CT CERVICAL SPINE WITHOUT CONTRAST TECHNIQUE: Multidetector CT imaging of the head and cervical spine was performed following the standard protocol without intravenous contrast. Multiplanar CT image reconstructions of the cervical spine were also generated. COMPARISON:  None. FINDINGS: CT HEAD FINDINGS Brain: There is no mass, hemorrhage or extra-axial  collection. The size and configuration of the ventricles and extra-axial CSF spaces are normal. The brain parenchyma is normal, without evidence of acute or chronic infarction. Vascular: No abnormal hyperdensity of the major intracranial arteries or dural venous sinuses. No intracranial atherosclerosis. Skull: The visualized skull base, calvarium and extracranial soft tissues are normal. Sinuses/Orbits: No fluid levels or advanced mucosal thickening of the visualized paranasal sinuses. No mastoid or middle ear effusion. The orbits are normal. CT CERVICAL SPINE FINDINGS Alignment: No static subluxation. Facets are aligned. Occipital condyles are normally positioned. Skull base and vertebrae: No acute fracture. C5-6 fusion. Soft tissues and spinal canal: No prevertebral fluid or swelling. No visible canal hematoma. Disc levels: No advanced spinal canal or neural foraminal stenosis. Upper chest: No pneumothorax, pulmonary nodule or pleural effusion. Other: Normal visualized paraspinal cervical soft tissues. IMPRESSION: 1. No acute intracranial abnormality. 2. No acute fracture or static subluxation of the cervical spine. Electronically Signed   By: Deatra Robinson M.D.   On: 10/18/2019 21:08   CT Cervical Spine Wo Contrast  Result Date: 10/18/2019 CLINICAL DATA:  Suspected drug overdose. Found unconscious. EXAM: CT HEAD WITHOUT CONTRAST CT CERVICAL SPINE WITHOUT CONTRAST TECHNIQUE: Multidetector CT imaging of the head and cervical spine was performed following the standard protocol  without intravenous contrast. Multiplanar CT image reconstructions of the cervical spine were also generated. COMPARISON:  None. FINDINGS: CT HEAD FINDINGS Brain: There is no mass, hemorrhage or extra-axial collection. The size and configuration of the ventricles and extra-axial CSF spaces are normal. The brain parenchyma is normal, without evidence of acute or chronic infarction. Vascular: No abnormal hyperdensity of the major  intracranial arteries or dural venous sinuses. No intracranial atherosclerosis. Skull: The visualized skull base, calvarium and extracranial soft tissues are normal. Sinuses/Orbits: No fluid levels or advanced mucosal thickening of the visualized paranasal sinuses. No mastoid or middle ear effusion. The orbits are normal. CT CERVICAL SPINE FINDINGS Alignment: No static subluxation. Facets are aligned. Occipital condyles are normally positioned. Skull base and vertebrae: No acute fracture. C5-6 fusion. Soft tissues and spinal canal: No prevertebral fluid or swelling. No visible canal hematoma. Disc levels: No advanced spinal canal or neural foraminal stenosis. Upper chest: No pneumothorax, pulmonary nodule or pleural effusion. Other: Normal visualized paraspinal cervical soft tissues. IMPRESSION: 1. No acute intracranial abnormality. 2. No acute fracture or static subluxation of the cervical spine. Electronically Signed   By: Deatra Robinson M.D.   On: 10/18/2019 21:08   DG Chest Port 1 View  Result Date: 10/18/2019 CLINICAL DATA:  Altered EXAM: PORTABLE CHEST 1 VIEW COMPARISON:  None. FINDINGS: The heart size and mediastinal contours are within normal limits. Both lungs are clear. The visualized skeletal structures are unremarkable. IMPRESSION: No active disease. Electronically Signed   By: Jasmine Pang M.D.   On: 10/18/2019 19:21    EKG: Personally reviewed.  Rhythm is sinus tachycardia with heart rate of 112 bpm with evidence of biatrial enlargement and right axis deviation.  No dynamic ST segment changes appreciated.  Assessment/Plan Active Problems:   Acute respiratory failure with hypoxia (HCC)   Patient exhibiting substantial hypoxia with oxygen saturations in the 80s after multiple attempts to wean supplemental oxygen  Considering polysubstance use with patient found down in his apartment and findings of significant left basilar rales on lung examination I am concerned today patient is  developing a left lower lobe aspiration pneumonia that is not yet apparent on chest x-ray on admission.  D-dimer elevated initial work-up so therefore obtaining CT angiogram of the chest  Placing patient on empiric regimen of intravenous ceftriaxone and azithromycin which can be quickly deescalated based on the clinical discretion of the daytime provider  Continue to provide supplemental oxygen as necessary  Blood cultures obtained    AKI (acute kidney injury) [facility])  Patient presenting with creatinine of 1.88 with no prior baseline  Patient is suspected to be suffering from mild acute kidney injury, likely secondary to volume depletion  Hydrating patient with intravenous isotonic fluids  Input and output monitoring  Monitoring renal function electrolytes with serial chemistries.    Alcoholic intoxication without complication [facility])   Patient endorses heavy alcohol use yesterday and was visibly extremely intoxicated upon arrival to the emergency department.  This is likely the etiology of the patient being found collapsed in his apartment however obtaining an echocardiogram in case there is an underlying cardiac etiology  Supportive care  Counseling n cessation    Cocaine use  Please see assessment and plan above   Code Status:  Full code Family Communication: deferred  Status is: Observation  The patient remains OBS appropriate and will d/c before 2 midnights.  Dispo: The patient is from: Home              Anticipated  d/c is to: Home              Anticipated d/c date is: 2 days              Patient currently is not medically stable to d/c.        Vernelle Emerald MD [facility] Pager [phone]  If 7PM-7AM, please contact night-coverage [email or web address] Use universal [place] password for that web site. If you do not have the password, please call the hospital operator.  10/19/2019, 6:16 AM

## 2019-10-19 NOTE — Progress Notes (Signed)
Spoke w/ Dr Leafy Half re: order for room air x 15 minutes prior to ABG.  Pt desats to 84% on room air within 2 minutes.  RT paged MD to notify of quick desat.  Per MD, place pt on room air and stick immediately once pt starts to desat.  Pt placed on room air for ABG, pt desat to 86% when ABG was obtained.

## 2019-10-19 NOTE — ED Notes (Signed)
Ordered breakfast--Leslie 

## 2019-10-19 NOTE — ED Provider Notes (Signed)
Patient signed out pending reevaluation.  Felt to be intoxicated and altered secondary to this.  0030 Informed by nursing that he has been hypoxic during his stay.  Reports without oxygen O2 sats have dropped as low as 50s.  Chest x-ray was clear.  He is currently on 6 L.  He is arousable to stimulus.  We will send Covid testing.  Suspect he may have aspirated.  We will continue to monitor.  If he remains oxygen dependent, he will need to be admitted to the hospital.  3:33 AM Patient continues to require oxygen.  Drops his O2 sats in the low 80s.  On my repeat evaluation he is awake but amnestic to events.  Denies significant drug use although he is positive for cocaine and alcohol.  He does have a creatinine of 1.88.  Unknown prior but likely related to dehydration.  Also with anion gap and I suspect depending upon his downtime, he may have had a lactic acidosis.  Covid testing is negative.  Given his ongoing oxygen requirement and AKI, will admit for observation and fluids.   Physical Exam  BP (!) 133/91   Pulse 95   Temp 97.9 F (36.6 C) (Oral)   Resp 14   SpO2 98%   Physical Exam  ED Course/Procedures   Clinical Course as of Oct 18 148  Wed Oct 18, 2019  2020 Reevaluated patient, he is more easily arousable.  Still with some slurred speech and not answering questions.  Lab work showing elevated creatinine of 1.88 and elevated glucose with no priors to compare with.  Does have an anion gap.  Elevated white count of 16.   [MB]    Clinical Course User Index [MB] Terrilee Files, MD        Shon Baton, MD 10/19/19 [phone]

## 2019-10-19 NOTE — Progress Notes (Signed)
PROGRESS NOTE    Kenneth Mccann  QJJ:[id number] DOB: 07/25/1985 DOA: 10/18/2019 PCP: Patient, No Pcp Per   Brief Narrative:  HPI On 10/19/2019 by Dr. Inda Merlin 34 year old male with no past medical history presents to [facility] emergency department brought in by ambulance after being found lying face down in his bathroom by a friend.  Patient is a poor historian secondary to lethargy secondary to polysubstance abuse.  Patient states that he was drinking alcohol yesterday, reporting drinking at least two 40 ounce bottles of beer but is unsure.  Patient also endorses cocaine use but is uncertain as to how much.  Patient denies any symptoms at the ordinary prior to his binge drinking and substance use.  Patient denies chest pain, shortness of breath, cough, fevers, nausea, vomiting, abdominal pain, diarrhea, recent travel, sick contacts or confirmed contact with COVID-19.  Upon evaluation in the emergency department patient was initially found to be extremely lethargic and minimally responsive feel to be secondary to polysubstance use.  Serum alcohol level found to be 179 with elevated creatinine of 1.8 and leukocytosis of 16.1.  Patient was also found to be exhibiting an oxygen requirement requiring up to 4 L of oxygen via nasal cannula and exhibiting several bouts of hypoxia whenever attempting to wean the patient.  Due to persisting hypoxemia and polysubstance use the hospitalist group was called to assess the patient for admission the hospital. Assessment & Plan   Admitted earlier today by Dr. Cyd Silence, see H&P for details.  Acute respiratory failure with hypoxia -Patient was noted to have oxygen saturations in the 80s and was placed on supplemental oxygen -Suspect secondary to his polysubstance abuse.  Patient was found down in his apartment. -Chest x-ray was unremarkable for active disease. -CTA chest: There for PE.  Multifocal pneumonia with greatest degree of airspace  opacity left lower lobe, to be in the right lower lobe as well as posterior segment right upper lobe. -COVID-19 negative -Patient was placed on ceftriaxone, azithromycin -Blood cultures pending -Will attempt to wean supplemental oxygen as possible -pending echocardiogram  Elevated D-dimer -CTA chest as above, negative for PE  Acute kidney injury -Creatinine on admission 1.88, with no prior laboratory data for comparison -Suspect secondary to volume depletion -Continue IV fluids -Monitor BMP  Alcohol intoxication -Patient endorsed heavy alcohol use prior to admission.  He was found collapsed in his apartment. -Continue CIWA protocol, thiamine, multivitamin, folate -Discussed cessation  Cocaine use -UDS positive for cocaine  DVT Prophylaxis  lovenox  Code Status: Full  Family Communication: None at bedside  Disposition Plan:  Status is: Observation  The patient remains OBS appropriate.  Dispo: The patient is from: Home              Anticipated d/c is to: Home              Anticipated d/c date is: 1 day              Patient currently is not medically stable to d/c.   Consultants None  Procedures  none  Antibiotics   Anti-infectives (From admission, onward)   Start     Dose/Rate Route Frequency Ordered Stop   10/19/19 0615  cefTRIAXone (ROCEPHIN) 2 g in sodium chloride 0.9 % 100 mL IVPB     2 g 200 mL/hr over 30 Minutes Intravenous Daily 10/19/19 0610 10/24/19 0559   10/19/19 0615  azithromycin (ZITHROMAX) 500 mg in sodium chloride 0.9 % 250 mL IVPB  500 mg 250 mL/hr over 60 Minutes Intravenous Daily 10/19/19 0610 10/24/19 0559      Subjective:   Kenneth Mccann seen and examined today.  Patient states his feeling fine but does not remember anything about yesterday. He denies any intentional harm to himself. Denies current chest pain, abdominal pain, nausea or vomiting, diarrhea or constipation.  Feels his breathing is improving.  Objective:   Vitals:     10/19/19 0845 10/19/19 0900 10/19/19 0950 10/19/19 0951  BP: (!) 133/99 (!) 145/114 125/80   Pulse: (!) 116 (!) 102    Resp: 19 (!) 24  17  Temp:      TempSrc:      SpO2: 97% 94%      Intake/Output Summary (Last 24 hours) at 10/19/2019 1054 Last data filed at 10/19/2019 0221 Gross per 24 hour  Intake 4961.7 ml  Output --  Net 4961.7 ml   There were no vitals filed for this visit.  Exam  General: Well developed, well nourished, NAD, appears stated age  HEENT: NCAT,mucous membranes moist.   Cardiovascular: S1 S2 auscultated, tachycardic  Respiratory: Diminished breath sounds, no wheezing  Abdomen: Soft, nontender, nondistended, + bowel sounds  Extremities: warm dry without cyanosis clubbing or edema  Neuro: AAOx3, nonfocal  Psych: Appropriate mood and affect   Data Reviewed: I have personally reviewed following labs and imaging studies  CBC: Recent Labs  Lab 10/18/19 1910 10/19/19 0647  WBC 16.1*  --   NEUTROABS 13.9*  --   HGB 14.3 13.9  HCT 45.1 41.0  MCV 101.1*  --   PLT 214  --    Basic Metabolic Panel: Recent Labs  Lab 10/18/19 1910 10/19/19 0647  NA 139 135  K 3.8 4.4  CL 103  --   CO2 16*  --   GLUCOSE 193*  --   BUN 11  --   CREATININE 1.88*  --   CALCIUM 8.7*  --    GFR: CrCl cannot be calculated (Unknown ideal weight.). Liver Function Tests: No results for input(s): AST, ALT, ALKPHOS, BILITOT, PROT, ALBUMIN in the last 168 hours. No results for input(s): LIPASE, AMYLASE in the last 168 hours. No results for input(s): AMMONIA in the last 168 hours. Coagulation Profile: No results for input(s): INR, PROTIME in the last 168 hours. Cardiac Enzymes: Recent Labs  Lab 10/19/19 0611  CKTOTAL 376   BNP (last 3 results) No results for input(s): PROBNP in the last 8760 hours. HbA1C: No results for input(s): HGBA1C in the last 72 hours. CBG: No results for input(s): GLUCAP in the last 168 hours. Lipid Profile: No results for input(s):  CHOL, HDL, LDLCALC, TRIG, CHOLHDL, LDLDIRECT in the last 72 hours. Thyroid Function Tests: No results for input(s): TSH, T4TOTAL, FREET4, T3FREE, THYROIDAB in the last 72 hours. Anemia Panel: No results for input(s): VITAMINB12, FOLATE, FERRITIN, TIBC, IRON, RETICCTPCT in the last 72 hours. Urine analysis:    Component Value Date/Time   COLORURINE YELLOW 10/19/2019 0610   APPEARANCEUR CLEAR 10/19/2019 0610   LABSPEC 1.013 10/19/2019 0610   PHURINE 5.0 10/19/2019 0610   GLUCOSEU NEGATIVE 10/19/2019 0610   HGBUR NEGATIVE 10/19/2019 0610   BILIRUBINUR NEGATIVE 10/19/2019 0610   KETONESUR 20 (A) 10/19/2019 0610   PROTEINUR 30 (A) 10/19/2019 0610   NITRITE NEGATIVE 10/19/2019 0610   LEUKOCYTESUR NEGATIVE 10/19/2019 0610   Sepsis Labs: @LABRCNTIP (procalcitonin:4,lacticidven:4)  ) Recent Results (from the past 240 hour(s))  SARS Coronavirus 2 by RT PCR (hospital order, performed in [facility] hospital lab) Nasopharyngeal Nasopharyngeal Swab     Status: None   Collection Time: 10/19/19 12:45 AM   Specimen: Nasopharyngeal Swab  Result Value Ref Range Status   SARS Coronavirus 2 NEGATIVE NEGATIVE Final    Comment: (NOTE) SARS-CoV-2 target nucleic acids are NOT DETECTED. The SARS-CoV-2 RNA is generally detectable in upper and lower respiratory specimens during the acute phase of infection. The lowest concentration of SARS-CoV-2 viral copies this assay can detect is 250 copies / mL. A negative result does not preclude SARS-CoV-2 infection and should not be used as the sole basis for treatment or other patient management decisions.  A negative result may occur with improper specimen collection / handling, submission of specimen other than nasopharyngeal swab, presence of viral mutation(s) within the areas targeted by this assay, and inadequate number of viral copies (<250 copies / mL). A negative result must be combined with clinical observations, patient history, and epidemiological  information. Fact Sheet for Patients:   BoilerBrush.com.cy Fact Sheet for Healthcare Providers: https://pope.com/ This test is not yet approved or cleared  by the [place] FDA and has been authorized for detection and/or diagnosis of SARS-CoV-2 by FDA under an Emergency Use Authorization (EUA).  This EUA will remain in effect (meaning this test can be used) for the duration of the COVID-19 declaration under Section 564(b)(1) of the Act, 21 U.S.C. section 360bbb-3(b)(1), unless the authorization is terminated or revoked sooner. Performed at [facility], [address] [place], [place] [address]       Radiology Studies: CT Head Wo Contrast  Result Date: 10/18/2019 CLINICAL DATA:  Suspected drug overdose. Found unconscious. EXAM: CT HEAD WITHOUT CONTRAST CT CERVICAL SPINE WITHOUT CONTRAST TECHNIQUE: Multidetector CT imaging of the head and cervical spine was performed following the standard protocol without intravenous contrast. Multiplanar CT image reconstructions of the cervical spine were also generated. COMPARISON:  None. FINDINGS: CT HEAD FINDINGS Brain: There is no mass, hemorrhage or extra-axial collection. The size and configuration of the ventricles and extra-axial CSF spaces are normal. The brain parenchyma is normal, without evidence of acute or chronic infarction. Vascular: No abnormal hyperdensity of the major intracranial arteries or dural venous sinuses. No intracranial atherosclerosis. Skull: The visualized skull base, calvarium and extracranial soft tissues are normal. Sinuses/Orbits: No fluid levels or advanced mucosal thickening of the visualized paranasal sinuses. No mastoid or middle ear effusion. The orbits are normal. CT CERVICAL SPINE FINDINGS Alignment: No static subluxation. Facets are aligned. Occipital condyles are normally positioned. Skull base and vertebrae: No acute fracture. C5-6 fusion. Soft tissues and  spinal canal: No prevertebral fluid or swelling. No visible canal hematoma. Disc levels: No advanced spinal canal or neural foraminal stenosis. Upper chest: No pneumothorax, pulmonary nodule or pleural effusion. Other: Normal visualized paraspinal cervical soft tissues. IMPRESSION: 1. No acute intracranial abnormality. 2. No acute fracture or static subluxation of the cervical spine. Electronically Signed   By: Deatra Robinson M.D.   On: 10/18/2019 21:08   CT ANGIO CHEST PE W OR WO CONTRAST  Result Date: 10/19/2019 CLINICAL DATA:  Shortness of breath and lethargy EXAM: CT ANGIOGRAPHY CHEST WITH CONTRAST TECHNIQUE: Multidetector CT imaging of the chest was performed using the standard protocol during bolus administration of intravenous contrast. Multiplanar CT image reconstructions and MIPs were obtained to evaluate the vascular anatomy. CONTRAST:  64mL OMNIPAQUE IOHEXOL 350 MG/ML SOLN COMPARISON:  Chest radiograph Oct 18, 2019 FINDINGS: Cardiovascular: There is no demonstrable pulmonary embolus. There is no thoracic aortic  aneurysm or dissection. Visualized great vessels appear normal. No appreciable pericardial effusion or pericardial thickening. Mediastinum/Nodes: Thyroid appears unremarkable. There is no appreciable thoracic adenopathy. Moderate air is noted in the esophagus. No focal esophageal lesions are evident by CT. Lungs/Pleura: There is airspace opacity with consolidation throughout much of the left lower lobe. More patchy infiltrate is noted in portions of the posterior and superior segments of the right lower lobe. There is ill-defined patchy opacity in the posterior segment right upper lobe. No appreciable pleural effusions are evident. Upper Abdomen: Visualized upper abdominal structures appear unremarkable. Musculoskeletal: No blastic or lytic bone lesions. No chest wall lesions evident. Review of the MIP images confirms the above findings. IMPRESSION: 1. No demonstrable pulmonary embolus. No  thoracic aortic aneurysm or dissection. 2. Multifocal pneumonia with the greatest degree of airspace opacity in the left lower lobe. Areas of airspace opacity also noted in the right lower lobe and posterior segment right upper lobe. Correlation with COVID-19 status may be advisable given these findings. 3.  No evident adenopathy. Electronically Signed   By: Bretta Bang III M.D.   On: 10/19/2019 07:59   CT Cervical Spine Wo Contrast  Result Date: 10/18/2019 CLINICAL DATA:  Suspected drug overdose. Found unconscious. EXAM: CT HEAD WITHOUT CONTRAST CT CERVICAL SPINE WITHOUT CONTRAST TECHNIQUE: Multidetector CT imaging of the head and cervical spine was performed following the standard protocol without intravenous contrast. Multiplanar CT image reconstructions of the cervical spine were also generated. COMPARISON:  None. FINDINGS: CT HEAD FINDINGS Brain: There is no mass, hemorrhage or extra-axial collection. The size and configuration of the ventricles and extra-axial CSF spaces are normal. The brain parenchyma is normal, without evidence of acute or chronic infarction. Vascular: No abnormal hyperdensity of the major intracranial arteries or dural venous sinuses. No intracranial atherosclerosis. Skull: The visualized skull base, calvarium and extracranial soft tissues are normal. Sinuses/Orbits: No fluid levels or advanced mucosal thickening of the visualized paranasal sinuses. No mastoid or middle ear effusion. The orbits are normal. CT CERVICAL SPINE FINDINGS Alignment: No static subluxation. Facets are aligned. Occipital condyles are normally positioned. Skull base and vertebrae: No acute fracture. C5-6 fusion. Soft tissues and spinal canal: No prevertebral fluid or swelling. No visible canal hematoma. Disc levels: No advanced spinal canal or neural foraminal stenosis. Upper chest: No pneumothorax, pulmonary nodule or pleural effusion. Other: Normal visualized paraspinal cervical soft tissues. IMPRESSION:  1. No acute intracranial abnormality. 2. No acute fracture or static subluxation of the cervical spine. Electronically Signed   By: Deatra Robinson M.D.   On: 10/18/2019 21:08   DG Chest Port 1 View  Result Date: 10/18/2019 CLINICAL DATA:  Altered EXAM: PORTABLE CHEST 1 VIEW COMPARISON:  None. FINDINGS: The heart size and mediastinal contours are within normal limits. Both lungs are clear. The visualized skeletal structures are unremarkable. IMPRESSION: No active disease. Electronically Signed   By: Jasmine Pang M.D.   On: 10/18/2019 19:21     Scheduled Meds: . enoxaparin (LOVENOX) injection  40 mg Subcutaneous Daily   Continuous Infusions: . azithromycin Stopped (10/19/19 0731)  . cefTRIAXone (ROCEPHIN)  IV Stopped (10/19/19 0701)  . lactated ringers 125 mL/hr at 10/19/19 0951     LOS: 0 days   Time Spent in minutes   30 minutes  Jacquelene Kopecky D.O. on 10/19/2019 at 10:54 AM  Between 7am to 7pm - Please see pager noted on [email or web address]  After 7pm go to [email or web address]  And look for the night coverage person covering for  me after hours  [facility] Office  [phone]

## 2019-10-19 NOTE — ED Notes (Signed)
Lunch Tray Ordered @ 1036. 

## 2019-10-20 DIAGNOSIS — J9600 Acute respiratory failure, unspecified whether with hypoxia or hypercapnia: Secondary | ICD-10-CM | POA: Diagnosis present

## 2019-10-20 LAB — COMPREHENSIVE METABOLIC PANEL
ALT: 40 U/L (ref 0–44)
AST: 49 U/L — ABNORMAL HIGH (ref 15–41)
Albumin: 3.5 g/dL (ref 3.5–5.0)
Alkaline Phosphatase: 62 U/L (ref 38–126)
Anion gap: 11 (ref 5–15)
BUN: <5 mg/dL — ABNORMAL LOW (ref 6–20)
CO2: 28 mmol/L (ref 22–32)
Calcium: 9.2 mg/dL (ref 8.9–10.3)
Chloride: 98 mmol/L (ref 98–111)
Creatinine, Ser: 0.89 mg/dL (ref 0.61–1.24)
GFR calc Af Amer: >60 mL/min (ref >60)
GFR calc non Af Amer: >60 mL/min (ref >60)
Glucose, Bld: 112 mg/dL — ABNORMAL HIGH (ref 70–99)
Potassium: 3.6 mmol/L (ref 3.5–5.1)
Sodium: 137 mmol/L (ref 135–145)
Total Bilirubin: 1.1 mg/dL (ref 0.3–1.2)
Total Protein: 6.2 g/dL — ABNORMAL LOW (ref 6.5–8.1)

## 2019-10-20 LAB — CBC WITH DIFFERENTIAL/PLATELET
Abs Immature Granulocytes: 0.07 10*3/uL (ref 0.00–0.07)
Basophils Absolute: 0 10*3/uL (ref 0.0–0.1)
Basophils Relative: 0 %
Eosinophils Absolute: 0 10*3/uL (ref 0.0–0.5)
Eosinophils Relative: 0 %
HCT: 35.3 % — ABNORMAL LOW (ref 39.0–52.0)
Hemoglobin: 11.6 g/dL — ABNORMAL LOW (ref 13.0–17.0)
Immature Granulocytes: 1 %
Lymphocytes Relative: 10 %
Lymphs Abs: 1.2 10*3/uL (ref 0.7–4.0)
MCH: 32 pg (ref 26.0–34.0)
MCHC: 32.9 g/dL (ref 30.0–36.0)
MCV: 97.5 fL (ref 80.0–100.0)
Monocytes Absolute: 0.9 10*3/uL (ref 0.1–1.0)
Monocytes Relative: 7 %
Neutro Abs: 10.1 10*3/uL — ABNORMAL HIGH (ref 1.7–7.7)
Neutrophils Relative %: 82 %
Platelets: 193 10*3/uL (ref 150–400)
RBC: 3.62 MIL/uL — ABNORMAL LOW (ref 4.22–5.81)
RDW: 14.2 % (ref 11.5–15.5)
WBC: 12.3 10*3/uL — ABNORMAL HIGH (ref 4.0–10.5)
nRBC: 0 % (ref 0.0–0.2)

## 2019-10-20 LAB — HEMOGLOBIN AND HEMATOCRIT, BLOOD
HCT: 35.5 % — ABNORMAL LOW (ref 39.0–52.0)
Hemoglobin: 11.7 g/dL — ABNORMAL LOW (ref 13.0–17.0)

## 2019-10-20 LAB — MAGNESIUM: Magnesium: 1.9 mg/dL (ref 1.7–2.4)

## 2019-10-20 LAB — LEGIONELLA PNEUMOPHILA SEROGP 1 UR AG: L. pneumophila Serogp 1 Ur Ag: NEGATIVE

## 2019-10-20 MED ORDER — BUTALBITAL-APAP-CAFFEINE 50-325-40 MG PO TABS
1.0000 | ORAL_TABLET | Freq: Four times a day (QID) | ORAL | Status: DC | PRN
  Administered 2019-10-20 – 2019-10-21 (×2): 1 via ORAL
  Filled 2019-10-20 (×2): qty 1

## 2019-10-20 NOTE — Evaluation (Signed)
Physical Therapy Evaluation/Discharge Patient Details Name: Kenneth Mccann MRN: [id number] DOB: 1986/04/25 Today's Date: 10/20/2019   History of Present Illness  34 y.o. male admitted on 10/18/19 for poly substance abuse after being found down on the floor in his home by a friend.  Serum alcohol level was 179 and (+) for cocaine.  Pt needed O2 in the ED (up to 4 L O2 [place]) possible aspiration PNA, D-dimer elevated, but scans negative for PE, AKI likely due to volume depleation.  Pt with no significant PMH.    Clinical Impression  Pt is mildly unsteady on his feet, but is able to self correct with supervision during gait.  O2 sats stable on RA with no signs of DOE (95-98% on RA during gait).  He reports improvement in HA and dizziness as the day progresses and he is able to rest and sleep.  He does not need acute or follow up therapy at this time.  PT to sign off.     Follow Up Recommendations No PT follow up    Equipment Recommendations  None recommended by PT    Recommendations for Other Services   NA    Precautions / Restrictions Precautions Precautions: Fall Precaution Comments: mildly unsteady on his feet      Mobility  Bed Mobility Overal bed mobility: Independent                Transfers Overall transfer level: Independent                  Ambulation/Gait Ambulation/Gait assistance: Supervision Gait Distance (Feet): 450 Feet Assistive device: None Gait Pattern/deviations: Step-through pattern;Staggering right;Staggering left Gait velocity: decreased Gait velocity interpretation: 1.31 - 2.62 ft/sec, indicative of limited community ambulator General Gait Details: Pt with mildly staggering gait pattern, able to self correct without external assist.          Balance Overall balance assessment: Needs assistance Sitting-balance support: Feet supported;No upper extremity supported Sitting balance-Leahy Scale: Good     Standing balance support: No upper  extremity supported Standing balance-Leahy Scale: Good               High level balance activites: Backward walking;Head turns;Turns;Other (comment)(pick up object from floor) High Level Balance Comments: supervision Standardized Balance Assessment Standardized Balance Assessment : Dynamic Gait Index   Dynamic Gait Index Level Surface: Mild Impairment Change in Gait Speed: Normal Gait with Horizontal Head Turns: Mild Impairment Gait with Vertical Head Turns: Mild Impairment Gait and Pivot Turn: Mild Impairment Step Over Obstacle: Mild Impairment Step Around Obstacles: Mild Impairment Steps: (NT)       Pertinent Vitals/Pain Pain Assessment: Faces Faces Pain Scale: Hurts little more Pain Location: head (thinks he hit his head) aching Pain Descriptors / Indicators: Aching Pain Intervention(s): Limited activity within patient's tolerance;Monitored during session;Repositioned    Home Living Family/patient expects to be discharged to:: Private residence Living Arrangements: Non-relatives/Friends(roommate) Available Help at Discharge: Available PRN/intermittently Type of Home: [place]: One level        Prior Function Level of Independence: Independent         Comments: off of work through the [employer].      Hand Dominance   Dominant Hand: Right    Extremity/Trunk Assessment   Upper Extremity Assessment Upper Extremity Assessment: Overall WFL for tasks assessed    Lower Extremity Assessment Lower Extremity Assessment: Generalized weakness    Cervical / Trunk Assessment Cervical / Trunk Assessment: Normal  Communication   Communication: No difficulties  Cognition Arousal/Alertness: Awake/alert Behavior During Therapy: WFL for tasks assessed/performed Overall Cognitive Status: Within Functional Limits for tasks assessed                                        General Comments General comments (skin integrity, edema,  etc.): O2 sats assess during gait as pt was on 2 L O2 [place] when I entered his room.  I took him off and serially checked his O2 sats all were in the upper 90s without signs of respiratory distress or DOE.  Pt reported mild HA and mild dizziness, but reporting it has been improving throughout the day.          Assessment/Plan    PT Assessment Patent does not need any further PT services         PT Goals (Current goals can be found in the Care Plan section)  Acute Rehab PT Goals PT Goal Formulation: All assessment and education complete, DC therapy               AM-PAC PT "6 Clicks" Mobility  Outcome Measure Help needed turning from your back to your side while in a flat bed without using bedrails?: None Help needed moving from lying on your back to sitting on the side of a flat bed without using bedrails?: None Help needed moving to and from a bed to a chair (including a wheelchair)?: None Help needed standing up from a chair using your arms (e.g., wheelchair or bedside chair)?: None Help needed to walk in hospital room?: None Help needed climbing 3-5 steps with a railing? : None 6 Click Score: 24    End of Session   Activity Tolerance: Patient limited by fatigue Patient left: in bed;with call bell/phone within reach   PT Visit Diagnosis: Other abnormalities of gait and mobility (R26.89)    Time: 8295-6213 PT Time Calculation (min) (ACUTE ONLY): 11 min   Charges:      Corinna Capra, PT, DPT  Acute Rehabilitation [phone] pager #[phone] office     PT Evaluation $PT Eval Low Complexity: 1 Low         10/20/2019, 2:58 PM

## 2019-10-20 NOTE — Plan of Care (Signed)

## 2019-10-20 NOTE — Progress Notes (Signed)
Unable to complete home O2 evaluation at this time because patient complains of dizziness and headache and does not want to walk outside the room.

## 2019-10-20 NOTE — Progress Notes (Signed)
SATURATION QUALIFICATIONS: (This note is used to comply with regulatory documentation for home oxygen)  Patient Saturations on Room Air at Rest = 99%  Patient Saturations on Room Air while Ambulating = 98%  Patient Saturations on NT Liters of oxygen while Ambulating = NT%  Please briefly explain why patient needs home oxygen: Pt ambulated >300' with PT on RA without DOE and O2 sats remained normal.  No O2 needed at discharge.   Corinna Capra, PT, DPT  Acute Rehabilitation [phone] pager [phone] office

## 2019-10-20 NOTE — TOC Initial Note (Signed)
Transition of Care [facility]) - Initial/Assessment Note    Patient Details  Name: Kenneth Mccann MRN: [id number] Date of Birth: 11/09/1985  Transition of Care [facility] CM/SW Contact:    Lawerance Sabal, RN Phone Number: 10/20/2019, 10:58 AM  Clinical Narrative:        Kenneth Mccann w patient at bedside. Given SA resources. Patient states that he is staying with a friend, this friend will accept him back at DC and is able to provide transportation home. Patient is agreeable for TOC to make F/U apt. CMA to call [facility] and phone patient with appointment time. Patient's cell at bedside is [phone]. TOC will follow DC meds to assess if appropriate for MATCH/ Good Rx programs.            Expected Discharge Plan: Home/Self Care Barriers to Discharge: Continued Medical Work up   Patient Goals and CMS Choice        Expected Discharge Plan and Services Expected Discharge Plan: Home/Self Care                                              Prior Living Arrangements/Services                       Activities of Daily Living Home Assistive Devices/Equipment: None ADL Screening (condition at time of admission) Patient's cognitive ability adequate to safely complete daily activities?: Yes Is the patient deaf or have difficulty hearing?: No Does the patient have difficulty seeing, even when wearing glasses/contacts?: No Does the patient have difficulty concentrating, remembering, or making decisions?: Yes Patient able to express need for assistance with ADLs?: Yes Does the patient have difficulty dressing or bathing?: No Independently performs ADLs?: Yes (appropriate for developmental age) Does the patient have difficulty walking or climbing stairs?: No Weakness of Legs: Both Weakness of Arms/Hands: None  Permission Sought/Granted                  Emotional Assessment              Admission diagnosis:  Hypoxia [R09.02] Acute respiratory failure with hypoxia (HCC)  [J96.01] AKI (acute kidney injury) (HCC) [N17.9] Altered mental status, unspecified altered mental status type [R41.82] Patient Active Problem List   Diagnosis Date Noted  . Alcoholic intoxication without complication (HCC) 10/19/2019  . Acute respiratory failure with hypoxia (HCC) 10/19/2019  . Cocaine use 10/19/2019  . AKI (acute kidney injury) (HCC) 10/19/2019   PCP:  Patient, No Pcp Per Pharmacy:   [id number] - [place], [place] - [address] AT [facility] [address] [place] [address] Phone: [phone] Fax: [phone]     Social Determinants of Health (SDOH) Interventions    Readmission Risk Interventions No flowsheet data found.

## 2019-10-20 NOTE — Progress Notes (Signed)
PROGRESS NOTE    Kenneth Mccann  QBH:[id number] DOB: Sep 27, 1985 DOA: 10/18/2019 PCP: Patient, No Pcp Per   Brief Narrative:  HPI On 10/19/2019 by Dr. Shauna Hugh 34 year old male with no past medical history presents to [facility] emergency department brought in by ambulance after being found lying face down in his bathroom by a friend.  Patient is a poor historian secondary to lethargy secondary to polysubstance abuse.  Patient states that he was drinking alcohol yesterday, reporting drinking at least two 40 ounce bottles of beer but is unsure.  Patient also endorses cocaine use but is uncertain as to how much.  Patient denies any symptoms at the ordinary prior to his binge drinking and substance use.  Patient denies chest pain, shortness of breath, cough, fevers, nausea, vomiting, abdominal pain, diarrhea, recent travel, sick contacts or confirmed contact with COVID-19.  Upon evaluation in the emergency department patient was initially found to be extremely lethargic and minimally responsive feel to be secondary to polysubstance use.  Serum alcohol level found to be 179 with elevated creatinine of 1.8 and leukocytosis of 16.1.  Patient was also found to be exhibiting an oxygen requirement requiring up to 4 L of oxygen via nasal cannula and exhibiting several bouts of hypoxia whenever attempting to wean the patient.  Due to persisting hypoxemia and polysubstance use the hospitalist group was called to assess the patient for admission the hospital. Assessment & Plan   Acute respiratory failure with hypoxia -Patient was noted to have oxygen saturations in the 80s and was placed on supplemental oxygen -Suspect secondary to his polysubstance abuse.  Patient was found down in his apartment. -Chest x-ray was unremarkable for active disease. -CTA chest: There for PE.  Multifocal pneumonia with greatest degree of airspace opacity left lower lobe, to be in the right lower lobe as well as  posterior segment right upper lobe. -COVID-19 negative -Patient was placed on ceftriaxone, azithromycin -Blood cultures show no growth to date -Will attempt to wean supplemental oxygen as possible -Echocardiogram: EF 60 to 65%, LV diastolic parameters were normal -Have ordered home desaturation test  Elevated D-dimer -CTA chest as above, negative for PE  Acute kidney injury -Creatinine on admission 1.88, with no prior laboratory data for comparison -Suspect secondary to volume depletion -Creatinine down to 0.89 -Continue IV fluids -Monitor BMP  Alcohol intoxication -Patient endorsed heavy alcohol use prior to admission.  He was found collapsed in his apartment. -Continue CIWA protocol, thiamine, multivitamin, folate -Discussed cessation  Cocaine use -UDS positive for cocaine  Headache -Will order Fioricet  DVT Prophylaxis  lovenox  Code Status: Full  Family Communication: None at bedside  Disposition Plan:  Status is: Observation  The patient will require care spanning > 2 midnights and should be moved to inpatient because: IV treatments appropriate due to intensity of illness or inability to take PO and weaning off of oxygen  Dispo: The patient is from: Home              Anticipated d/c is to: Home              Anticipated d/c date is: 1 day              Patient currently is not medically stable to d/c.  Consultants None  Procedures  none  Antibiotics   Anti-infectives (From admission, onward)   Start     Dose/Rate Route Frequency Ordered Stop   10/19/19 0615  cefTRIAXone (ROCEPHIN) 2 g in sodium  chloride 0.9 % 100 mL IVPB     2 g 200 mL/hr over 30 Minutes Intravenous Daily 10/19/19 0610 10/24/19 0559   10/19/19 0615  azithromycin (ZITHROMAX) 500 mg in sodium chloride 0.9 % 250 mL IVPB     500 mg 250 mL/hr over 60 Minutes Intravenous Daily 10/19/19 0610 10/24/19 0559      Subjective:   Kenneth Mccann seen and examined today.  Complains of headache  and feeling tired. Denies current breath, chest pain, abdominal pain, nausea or vomiting, diarrhea or constipation, dizziness.   Objective:   Vitals:   10/19/19 2023 10/20/19 0500 10/20/19 0600 10/20/19 0720  BP: 123/78   (!) 142/91  Pulse: 93   88  Resp: 17 (!) Temp: 98.5 F (36.9 C)   99.4 F (37.4 C)  TempSrc: Oral     SpO2: 99%   99%    Intake/Output Summary (Last 24 hours) at 10/20/2019 1322 Last data filed at 10/20/2019 1300 Gross per 24 hour  Intake 2430 ml  Output 1500 ml  Net 930 ml   There were no vitals filed for this visit.  Exam  General: Well developed, well nourished, NAD, appears stated age  HEENT: NCAT, mucous membranes moist.   Cardiovascular: S1 S2 auscultated, RRR  Respiratory: Clear to auscultation bilaterally  Abdomen: Soft, nontender, nondistended, + bowel sounds  Extremities: warm dry without cyanosis clubbing or edema  Neuro: AAOx3, nonfocal  Psych: Appropriate mood and affect  Data Reviewed: I have personally reviewed following labs and imaging studies  CBC: Recent Labs  Lab 10/18/19 1910 10/19/19 0647 10/19/19 1144 10/20/19 0303  WBC 16.1*  --  17.9* 12.3*  NEUTROABS 13.9*  --   --  10.1*  HGB 14.3 13.9 12.8* 11.6*  HCT 45.1 41.0 40.2 35.3*  MCV 101.1*  --  100.8* 97.5  PLT 214  --  220 193   Basic Metabolic Panel: Recent Labs  Lab 10/18/19 1910 10/19/19 0647 10/19/19 1144 10/20/19 0303  NA 139 135 138 137  K 3.8 4.4 4.4 3.6  CL 103  --  103 98  CO2 16*  --  23 28  GLUCOSE 193*  --  113* 112*  BUN 11  --  10 <5*  CREATININE 1.88*  --  0.90 0.89  CALCIUM 8.7*  --  8.6* 9.2  MG  --   --  1.7 1.9  PHOS  --   --  3.1  --    GFR: CrCl cannot be calculated (Unknown ideal weight.). Liver Function Tests: Recent Labs  Lab 10/19/19 1144 10/20/19 0303  AST 66* 49*  ALT 49* 40  ALKPHOS 63 62  BILITOT 1.3* 1.1  PROT 7.3 6.2*  ALBUMIN 4.3 3.5   No results for input(s): LIPASE, AMYLASE in the last 168  hours. No results for input(s): AMMONIA in the last 168 hours. Coagulation Profile: No results for input(s): INR, PROTIME in the last 168 hours. Cardiac Enzymes: Recent Labs  Lab 10/19/19 0611  CKTOTAL 376   BNP (last 3 results) No results for input(s): PROBNP in the last 8760 hours. HbA1C: No results for input(s): HGBA1C in the last 72 hours. CBG: No results for input(s): GLUCAP in the last 168 hours. Lipid Profile: No results for input(s): CHOL, HDL, LDLCALC, TRIG, CHOLHDL, LDLDIRECT in the last 72 hours. Thyroid Function Tests: No results for input(s): TSH, T4TOTAL, FREET4, T3FREE, THYROIDAB in the last 72 hours. Anemia Panel: No results for input(s): VITAMINB12, FOLATE, FERRITIN, TIBC,  IRON, RETICCTPCT in the last 72 hours. Urine analysis:    Component Value Date/Time   COLORURINE YELLOW 10/19/2019 0610   APPEARANCEUR CLEAR 10/19/2019 0610   LABSPEC 1.013 10/19/2019 0610   PHURINE 5.0 10/19/2019 0610   GLUCOSEU NEGATIVE 10/19/2019 0610   HGBUR NEGATIVE 10/19/2019 0610   BILIRUBINUR NEGATIVE 10/19/2019 0610   KETONESUR 20 (A) 10/19/2019 0610   PROTEINUR 30 (A) 10/19/2019 0610   NITRITE NEGATIVE 10/19/2019 0610   LEUKOCYTESUR NEGATIVE 10/19/2019 0610   Sepsis Labs: @LABRCNTIP (procalcitonin:4,lacticidven:4)  ) Recent Results (from the past 240 hour(s))  SARS Coronavirus 2 by RT PCR (hospital order, performed in [facility] hospital lab) Nasopharyngeal Nasopharyngeal Swab     Status: None   Collection Time: 10/19/19 12:45 AM   Specimen: Nasopharyngeal Swab  Result Value Ref Range Status   SARS Coronavirus 2 NEGATIVE NEGATIVE Final    Comment: (NOTE) SARS-CoV-2 target nucleic acids are NOT DETECTED. The SARS-CoV-2 RNA is generally detectable in upper and lower respiratory specimens during the acute phase of infection. The lowest concentration of SARS-CoV-2 viral copies this assay can detect is 250 copies / mL. A negative result does not preclude SARS-CoV-2  infection and should not be used as the sole basis for treatment or other patient management decisions.  A negative result may occur with improper specimen collection / handling, submission of specimen other than nasopharyngeal swab, presence of viral mutation(s) within the areas targeted by this assay, and inadequate number of viral copies (<250 copies / mL). A negative result must be combined with clinical observations, patient history, and epidemiological information. Fact Sheet for Patients:   10/21/19 Fact Sheet for Healthcare Providers: BoilerBrush.com.cy This test is not yet approved or cleared  by the https://pope.com/ FDA and has been authorized for detection and/or diagnosis of SARS-CoV-2 by FDA under an Emergency Use Authorization (EUA).  This EUA will remain in effect (meaning this test can be used) for the duration of the COVID-19 declaration under Section 564(b)(1) of the Act, 21 U.S.C. section 360bbb-3(b)(1), unless the authorization is terminated or revoked sooner. Performed at [facility], [address] [place], [place]   Culture, blood (routine x 2)     Status: None (Preliminary result)   Collection Time: 10/19/19  6:30 AM   Specimen: BLOOD LEFT ARM  Result Value Ref Range Status   Specimen Description BLOOD LEFT ARM  Final   Special Requests   Final    BOTTLES DRAWN AEROBIC AND ANAEROBIC Blood Culture adequate volume   Culture   Final    NO GROWTH < 24 HOURS Performed at [facility], [address], [place], [place]    Report Status PENDING  Incomplete  Culture, blood (routine x 2)     Status: None (Preliminary result)   Collection Time: 10/19/19  6:30 AM   Specimen: BLOOD LEFT ARM  Result Value Ref Range Status   Specimen Description BLOOD LEFT ARM  Final   Special Requests   Final    BOTTLES DRAWN AEROBIC AND ANAEROBIC Blood Culture adequate volume   Culture   Final     NO GROWTH < 24 HOURS Performed at [facility], [address], [place], [place]    Report Status PENDING  Incomplete      Radiology Studies: CT Head Wo Contrast  Result Date: 10/18/2019 CLINICAL DATA:  Suspected drug overdose. Found unconscious. EXAM: CT HEAD WITHOUT CONTRAST CT CERVICAL SPINE WITHOUT CONTRAST TECHNIQUE: Multidetector CT  imaging of the head and cervical spine was performed following the standard protocol without intravenous contrast. Multiplanar CT image reconstructions of the cervical spine were also generated. COMPARISON:  None. FINDINGS: CT HEAD FINDINGS Brain: There is no mass, hemorrhage or extra-axial collection. The size and configuration of the ventricles and extra-axial CSF spaces are normal. The brain parenchyma is normal, without evidence of acute or chronic infarction. Vascular: No abnormal hyperdensity of the major intracranial arteries or dural venous sinuses. No intracranial atherosclerosis. Skull: The visualized skull base, calvarium and extracranial soft tissues are normal. Sinuses/Orbits: No fluid levels or advanced mucosal thickening of the visualized paranasal sinuses. No mastoid or middle ear effusion. The orbits are normal. CT CERVICAL SPINE FINDINGS Alignment: No static subluxation. Facets are aligned. Occipital condyles are normally positioned. Skull base and vertebrae: No acute fracture. C5-6 fusion. Soft tissues and spinal canal: No prevertebral fluid or swelling. No visible canal hematoma. Disc levels: No advanced spinal canal or neural foraminal stenosis. Upper chest: No pneumothorax, pulmonary nodule or pleural effusion. Other: Normal visualized paraspinal cervical soft tissues. IMPRESSION: 1. No acute intracranial abnormality. 2. No acute fracture or static subluxation of the cervical spine. Electronically Signed   By: Deatra Robinson M.D.   On: 10/18/2019 21:08   CT ANGIO CHEST PE W OR WO CONTRAST  Result Date: 10/19/2019 CLINICAL DATA:   Shortness of breath and lethargy EXAM: CT ANGIOGRAPHY CHEST WITH CONTRAST TECHNIQUE: Multidetector CT imaging of the chest was performed using the standard protocol during bolus administration of intravenous contrast. Multiplanar CT image reconstructions and MIPs were obtained to evaluate the vascular anatomy. CONTRAST:  60mL OMNIPAQUE IOHEXOL 350 MG/ML SOLN COMPARISON:  Chest radiograph Oct 18, 2019 FINDINGS: Cardiovascular: There is no demonstrable pulmonary embolus. There is no thoracic aortic aneurysm or dissection. Visualized great vessels appear normal. No appreciable pericardial effusion or pericardial thickening. Mediastinum/Nodes: Thyroid appears unremarkable. There is no appreciable thoracic adenopathy. Moderate air is noted in the esophagus. No focal esophageal lesions are evident by CT. Lungs/Pleura: There is airspace opacity with consolidation throughout much of the left lower lobe. More patchy infiltrate is noted in portions of the posterior and superior segments of the right lower lobe. There is ill-defined patchy opacity in the posterior segment right upper lobe. No appreciable pleural effusions are evident. Upper Abdomen: Visualized upper abdominal structures appear unremarkable. Musculoskeletal: No blastic or lytic bone lesions. No chest wall lesions evident. Review of the MIP images confirms the above findings. IMPRESSION: 1. No demonstrable pulmonary embolus. No thoracic aortic aneurysm or dissection. 2. Multifocal pneumonia with the greatest degree of airspace opacity in the left lower lobe. Areas of airspace opacity also noted in the right lower lobe and posterior segment right upper lobe. Correlation with COVID-19 status may be advisable given these findings. 3.  No evident adenopathy. Electronically Signed   By: Bretta Bang III M.D.   On: 10/19/2019 07:59   CT Cervical Spine Wo Contrast  Result Date: 10/18/2019 CLINICAL DATA:  Suspected drug overdose. Found unconscious. EXAM: CT  HEAD WITHOUT CONTRAST CT CERVICAL SPINE WITHOUT CONTRAST TECHNIQUE: Multidetector CT imaging of the head and cervical spine was performed following the standard protocol without intravenous contrast. Multiplanar CT image reconstructions of the cervical spine were also generated. COMPARISON:  None. FINDINGS: CT HEAD FINDINGS Brain: There is no mass, hemorrhage or extra-axial collection. The size and configuration of the ventricles and extra-axial CSF spaces are normal. The brain parenchyma is normal, without evidence of acute or chronic infarction. Vascular: No  abnormal hyperdensity of the major intracranial arteries or dural venous sinuses. No intracranial atherosclerosis. Skull: The visualized skull base, calvarium and extracranial soft tissues are normal. Sinuses/Orbits: No fluid levels or advanced mucosal thickening of the visualized paranasal sinuses. No mastoid or middle ear effusion. The orbits are normal. CT CERVICAL SPINE FINDINGS Alignment: No static subluxation. Facets are aligned. Occipital condyles are normally positioned. Skull base and vertebrae: No acute fracture. C5-6 fusion. Soft tissues and spinal canal: No prevertebral fluid or swelling. No visible canal hematoma. Disc levels: No advanced spinal canal or neural foraminal stenosis. Upper chest: No pneumothorax, pulmonary nodule or pleural effusion. Other: Normal visualized paraspinal cervical soft tissues. IMPRESSION: 1. No acute intracranial abnormality. 2. No acute fracture or static subluxation of the cervical spine. Electronically Signed   By: Ulyses Jarred M.D.   On: 10/18/2019 21:08   DG Chest Port 1 View  Result Date: 10/18/2019 CLINICAL DATA:  Altered EXAM: PORTABLE CHEST 1 VIEW COMPARISON:  None. FINDINGS: The heart size and mediastinal contours are within normal limits. Both lungs are clear. The visualized skeletal structures are unremarkable. IMPRESSION: No active disease. Electronically Signed   By: Donavan Foil M.D.   On:  10/18/2019 19:21   ECHOCARDIOGRAM COMPLETE  Result Date: 10/19/2019    ECHOCARDIOGRAM REPORT   Patient Name:   Kenneth Mccann Date of Exam: 10/19/2019 Medical Rec #:  [id number]        Height:       68.0 in Accession #:    [id number]       Weight:       135.0 lb Date of Birth:  1985-07-24         BSA:          1.729 m Patient Age:    89 years         BP:           125/80 mmHg Patient Gender: M                HR:           93 bpm. Exam Location:  Inpatient Procedure: 2D Echo Indications:    Syncope 780.2 / R55  History:        Patient has no prior history of Echocardiogram examinations.                 Cocaine use                 Acute kidney injury                 Alcoholic intoxication                 Acute respiratory failure.  Sonographer:    Vikki Ports Turrentine Referring Phys: [id number] [place]  1. Left ventricular ejection fraction, by estimation, is 60 to 65%. The left ventricle has normal function. The left ventricle has no regional wall motion abnormalities. Left ventricular diastolic parameters were normal.  2. Right ventricular systolic function is normal. The right ventricular size is normal. There is normal pulmonary artery systolic pressure.  3. The mitral valve is normal in structure. No evidence of mitral valve regurgitation. No evidence of mitral stenosis.  4. The aortic valve is normal in structure. Aortic valve regurgitation is not visualized. No aortic stenosis is present.  5. The inferior vena cava is normal in size with greater than 50% respiratory variability, suggesting right atrial pressure of 3 mmHg. Conclusion(s)/Recommendation(s): Normal biventricular function  without evidence of hemodynamically significant valvular heart disease. FINDINGS  Left Ventricle: Left ventricular ejection fraction, by estimation, is 60 to 65%. The left ventricle has normal function. The left ventricle has no regional wall motion abnormalities. The left ventricular internal cavity size was  normal in size. There is  no left ventricular hypertrophy. Left ventricular diastolic parameters were normal. Right Ventricle: The right ventricular size is normal. No increase in right ventricular wall thickness. Right ventricular systolic function is normal. There is normal pulmonary artery systolic pressure. The tricuspid regurgitant velocity is 2.55 m/s, and  with an assumed right atrial pressure of 3 mmHg, the estimated right ventricular systolic pressure is 29.0 mmHg. Left Atrium: Left atrial size was normal in size. Right Atrium: Right atrial size was normal in size. Pericardium: There is no evidence of pericardial effusion. Mitral Valve: The mitral valve is normal in structure. Normal mobility of the mitral valve leaflets. No evidence of mitral valve regurgitation. No evidence of mitral valve stenosis. Tricuspid Valve: The tricuspid valve is normal in structure. Tricuspid valve regurgitation is not demonstrated. No evidence of tricuspid stenosis. Aortic Valve: The aortic valve is normal in structure. Aortic valve regurgitation is not visualized. No aortic stenosis is present. Pulmonic Valve: The pulmonic valve was normal in structure. Pulmonic valve regurgitation is not visualized. No evidence of pulmonic stenosis. Aorta: The aortic root is normal in size and structure. Venous: The inferior vena cava is normal in size with greater than 50% respiratory variability, suggesting right atrial pressure of 3 mmHg. IAS/Shunts: No atrial level shunt detected by color flow Doppler.  LEFT VENTRICLE PLAX 2D LVIDd:         4.60 cm  Diastology LVIDs:         3.00 cm  LV e' lateral:   10.80 cm/s LV PW:         0.90 cm  LV E/e' lateral: 7.7 LV IVS:        0.90 cm  LV e' medial:    10.90 cm/s LVOT diam:     1.80 cm  LV E/e' medial:  7.6 LV SV:         49 LV SV Index:   28 LVOT Area:     2.54 cm  RIGHT VENTRICLE RV S prime:     16.30 cm/s TAPSE (M-mode): 2.5 cm LEFT ATRIUM             Index       RIGHT ATRIUM           Index  LA diam:        2.70 cm 1.56 cm/m  RA Area:     15.20 cm LA Vol (A2C):   31.2 ml 18.04 ml/m RA Volume:   40.30 ml  23.30 ml/m LA Vol (A4C):   29.5 ml 17.06 ml/m LA Biplane Vol: 30.5 ml 17.64 ml/m  AORTIC VALVE LVOT Vmax:   114.00 cm/s LVOT Vmean:  85.900 cm/s LVOT VTI:    0.193 m  AORTA Ao Root diam: 2.70 cm MITRAL VALVE               TRICUSPID VALVE MV Area (PHT): 4.36 cm    TR Peak grad:   26.0 mmHg MV Decel Time: 174 msec    TR Vmax:        255.00 cm/s MV E velocity: 83.10 cm/s MV A velocity: 60.80 cm/s  SHUNTS MV E/A ratio:  1.37        Systemic VTI:  0.19  m                            Systemic Diam: 1.80 cm Zoila ShutterKenneth Hilty MD Electronically signed by Zoila ShutterKenneth Hilty MD Signature Date/Time: 10/19/2019/2:05:27 PM    Final      Scheduled Meds: . enoxaparin (LOVENOX) injection  40 mg Subcutaneous Daily  . folic acid  1 mg Oral Daily  . multivitamin with minerals  1 tablet Oral Daily  . thiamine  100 mg Oral Daily   Continuous Infusions: . azithromycin 500 mg (10/20/19 0700)  . cefTRIAXone (ROCEPHIN)  IV 2 g (10/20/19 [id number])     LOS: 0 days   Time Spent in minutes   30 minutes  Amandy Chubbuck D.O. on 10/20/2019 at 1:22 PM  Between 7am to 7pm - Please see pager noted on [email or web address]  After 7pm go to [email or web address]  And look for the night coverage person covering for me after hours  [facility] Office  [phone]

## 2019-10-21 MED ORDER — BUTALBITAL-APAP-CAFFEINE 50-325-40 MG PO TABS
1.0000 | ORAL_TABLET | ORAL | Status: DC | PRN
  Administered 2019-10-21: 2 via ORAL
  Filled 2019-10-21: qty 2

## 2019-10-21 MED ORDER — AMOXICILLIN-POT CLAVULANATE 875-125 MG PO TABS: 1.0000 | ORAL_TABLET | Freq: Two times a day (BID) | ORAL | 0 refills | Status: AC

## 2019-10-21 MED ORDER — FOLIC ACID 1 MG PO TABS: 1.0000 mg | ORAL_TABLET | Freq: Every day | ORAL | Status: AC

## 2019-10-21 MED ORDER — THIAMINE HCL 100 MG PO TABS: 100.0000 mg | ORAL_TABLET | Freq: Every day | ORAL | Status: AC

## 2019-10-21 MED ORDER — ADULT MULTIVITAMIN W/MINERALS CH: 1.0000 | ORAL_TABLET | Freq: Every day | ORAL | Status: AC

## 2019-10-21 NOTE — Care Management (Signed)
Good Rx coupon for DC meds sent to patient's phone via text. Spoke w patient and confirmed that he received it. Discussed follow up appointment as well.

## 2019-10-21 NOTE — Discharge Instructions (Signed)
Acute Kidney Injury, Adult  Acute kidney injury is a sudden worsening of kidney function. The kidneys are organs that have several jobs. They filter the blood to remove waste products and extra fluid. They also maintain a healthy balance of minerals and hormones in the body, which helps control blood pressure and keep bones strong. With this condition, your kidneys do not do their jobs as well as they should. This condition ranges from mild to severe. Over time it may develop into long-lasting (chronic) kidney disease. Early detection and treatment may prevent acute kidney injury from developing into a chronic condition. What are the causes? Common causes of this condition include:  A problem with blood flow to the kidneys. This may be caused by: ? Low blood pressure (hypotension) or shock. ? Blood loss. ? Heart and blood vessel (cardiovascular) disease. ? Severe burns. ? Liver disease.  Direct damage to the kidneys. This may be caused by: ? Certain medicines. ? A kidney infection. ? Poisoning. ? Being around or in contact with toxic substances. ? A surgical wound. ? A hard, direct hit to the kidney area.  A sudden blockage of urine flow. This may be caused by: ? Cancer. ? Kidney stones. ? An enlarged prostate in males. What are the signs or symptoms? Symptoms of this condition may not be obvious until the condition becomes severe. Symptoms of this condition can include:  Tiredness (lethargy), or difficulty staying awake.  Nausea or vomiting.  Swelling (edema) of the face, legs, ankles, or feet.  Problems with urination, such as: ? Abdominal pain, or pain along the side of your stomach (flank). ? Decreased urine production. ? Decrease in the force of urine flow.  Muscle twitches and cramps, especially in the legs.  Confusion or trouble concentrating.  Loss of appetite.  Fever. How is this diagnosed? This condition may be diagnosed with tests, including:  Blood  tests.  Urine tests.  Imaging tests.  A test in which a sample of tissue is removed from the kidneys to be examined under a microscope (kidney biopsy). How is this treated? Treatment for this condition depends on the cause and how severe the condition is. In mild cases, treatment may not be needed. The kidneys may heal on their own. In more severe cases, treatment will involve:  Treating the cause of the kidney injury. This may involve changing any medicines you are taking or adjusting your dosage.  Fluids. You may need specialized IV fluids to balance your body's needs.  Having a catheter placed to drain urine and prevent blockages.  Preventing problems from occurring. This may mean avoiding certain medicines or procedures that can cause further injury to the kidneys. In some cases treatment may also require:  A procedure to remove toxic wastes from the body (dialysis or continuous renal replacement therapy - CRRT).  Surgery. This may be done to repair a torn kidney, or to remove the blockage from the urinary system. Follow these instructions at home: Medicines  Take over-the-counter and prescription medicines only as told by your health care provider.  Do not take any new medicines without your health care provider's approval. Many medicines can worsen your kidney damage.  Do not take any vitamin and mineral supplements without your health care provider's approval. Many nutritional supplements can worsen your kidney damage. Lifestyle  If your health care provider prescribed changes to your diet, follow them. You may need to decrease the amount of protein you eat.  Achieve and maintain a healthy   weight. If you need help with this, ask your health care provider.  Start or continue an exercise plan. Try to exercise at least 30 minutes a day, 5 days a week.  Do not use any tobacco products, such as cigarettes, chewing tobacco, and e-cigarettes. If you need help quitting, ask your  health care provider. General instructions  Keep track of your blood pressure. Report changes in your blood pressure as told by your health care provider.  Stay up to date with immunizations. Ask your health care provider which immunizations you need.  Keep all follow-up visits as told by your health care provider. This is important. Where to find more information  [employer] of Kidney Patients: ResidentialShow.is  [employer]: www.kidney.org  [employer]: FightingMatch.com.ee  [employer] [facility]: ? www.lifeoptions.org ? www.kidneyschool.org Contact a health care provider if:  Your symptoms get worse.  You develop new symptoms. Get help right away if:  You develop symptoms of worsening kidney disease, which include: ? Headaches. ? Abnormally dark or light skin. ? Easy bruising. ? Frequent hiccups. ? Chest pain. ? Shortness of breath. ? End of menstruation in women. ? Seizures. ? Confusion or altered mental status. ? Abdominal or back pain. ? Itchiness.  You have a fever.  Your body is producing less urine.  You have pain or bleeding when you urinate. Summary  Acute kidney injury is a sudden worsening of kidney function.  Acute kidney injury can be caused by problems with blood flow to the kidneys, direct damage to the kidneys, and sudden blockage of urine flow.  Symptoms of this condition may not be obvious until it becomes severe. Symptoms may include edema, lethargy, confusion, nausea or vomiting, and problems passing urine.  This condition can usually be diagnosed with blood tests, urine tests, and imaging tests. Sometimes a kidney biopsy is done to diagnose this condition.  Treatment for this condition often involves treating the underlying cause. It is treated with fluids, medicines, dialysis, diet changes, or surgery. This information is not intended to replace advice given to you by your health care provider. Make  sure you discuss any questions you have with your health care provider. Document Revised: 04/23/2017 Document Reviewed: 05/01/2016 Elsevier Patient Education  2020 [employer]   Hypoxemia  Hypoxemia occurs when the blood does not contain enough oxygen. The body cannot work well when it does not have enough oxygen because every part of the body needs oxygen. Oxygen enters the lungs when we breathe in, then it travels to all parts of the body through the blood. Hypoxemia can develop suddenly or slowly. What are the causes? Common causes of this condition include:  Long-term (chronic) lung diseases, such as chronic obstructive pulmonary disease (COPD) or interstitial lung disease.  Disorders that affect breathing at night, such as sleep apnea.  Fluid buildup in the lungs (pulmonary edema).  Lung infection (pneumonia).  Lung or throat cancer.  Abnormal blood flow that bypasses the lungs (having a shunt).  Certain diseases that affect nerves or muscles.  A collapsed lung (pneumothorax).  A blood clot in the lungs (pulmonary embolus).  Certain types of heart disease.  Slow or shallow breathing (hypoventilation).  Certain medicines.  High altitudes.  Toxic chemicals, smoke, and gases. What are the signs or symptoms? In some cases, there may be no symptoms of this condition. If you do have symptoms, they may include:  Shortness of breath (dyspnea).  Bluish color of the skin, lips, or nail beds.  Breathing that is fast, noisy, or shallow.  A fast heartbeat.  Feeling tired or sleepy.  Feeling confused or worried. If hypoxemia develops quickly, you will likely have dyspnea. If hypoxemia develops slowly over months or years, you may not notice any symptoms. How is this diagnosed? This condition is diagnosed by:  A physical exam.  Blood tests.  A test that measures the percentage of oxygen in your blood (pulse oximetry). This is done with a sensor that is placed on  your finger, toe, or earlobe. How is this treated? Treatment for this condition depends on the underlying cause of your hypoxemia. You will likely be treated with oxygen therapy to restore your blood oxygen level. Depending on the cause of your hypoxemia, you may need oxygen therapy for a short time (weeks or months), or you may need it for the rest of your life. Your health care provider may also recommend other therapies to treat the underlying cause of your hypoxemia. Follow these instructions at home:   Take over-the-counter and prescription medicines only as told by your health care provider.  If you are on oxygen therapy, follow oxygen safety precautions as directed by your health care provider. These may include: ? Always having a backup supply of oxygen. ? Not allowing anyone to smoke or have a fire around oxygen. ? Handling oxygen tanks carefully and as instructed.  Do not use any products that contain nicotine or tobacco, such as cigarettes and e-cigarettes. If you need help quitting, ask your health care provider. Stay away from people who smoke.  Keep all follow-up visits as told by your health care provider. This is important. Contact a health care provider if:  You have any concerns about your oxygen therapy.  You have trouble breathing, even during or after treatment.  You become short of breath when you exercise.  You are tired when you wake up.  You have a headache when you wake up. Get help right away if:  Your shortness of breath gets worse, especially with normal or minimal activity.  You have a bluish color of the skin, lips, or nail beds.  You become confused or you cannot think properly.  You cough up dark mucus or blood.  You have chest pain.  You have a fever. Summary  Hypoxemia occurs when the blood does not contain enough oxygen.  Hypoxemia may or may not cause symptoms. Often, the main symptom is shortness of breath (dyspnea).  Depending on  the cause of your hypoxemia, you may need oxygen therapy for a short time (weeks or months), or you may need it for the rest of your life.  If you are on oxygen therapy, follow oxygen safety precautions as directed by your health care provider. This information is not intended to replace advice given to you by your health care provider. Make sure you discuss any questions you have with your health care provider. Document Revised: 03/01/2018 Document Reviewed: 04/14/2016 Elsevier Patient Education  [place] Pneumonia, Adult Pneumonia is an infection of the lungs. It causes swelling in the airways of the lungs. Mucus and fluid may also build up inside the airways. One type of pneumonia can happen while a person is in a hospital. A different type can happen when a person is not in a hospital (community-acquired pneumonia).  What are the causes?  This condition is caused by germs (viruses, bacteria, or fungi). Some types of germs can be passed from one person to another. This  can happen when you breathe in droplets from the cough or sneeze of an infected person. What increases the risk? You are more likely to develop this condition if you:  Have a long-term (chronic) disease, such as: ? Chronic obstructive pulmonary disease (COPD). ? Asthma. ? Cystic fibrosis. ? Congestive heart failure. ? Diabetes. ? Kidney disease.  Have HIV.  Have sickle cell disease.  Have had your spleen removed.  Do not take good care of your teeth and mouth (poor dental hygiene).  Have a medical condition that increases the risk of breathing in droplets from your own mouth and nose.  Have a weakened body defense system (immune system).  Are a smoker.  Travel to areas where the germs that cause this illness are common.  Are around certain animals or the places they live. What are the signs or symptoms?  A dry cough.  A wet (productive)  cough.  Fever.  Sweating.  Chest pain. This often happens when breathing deeply or coughing.  Fast breathing or trouble breathing.  Shortness of breath.  Shaking chills.  Feeling tired (fatigue).  Muscle aches. How is this treated? Treatment for this condition depends on many things. Most adults can be treated at home. In some cases, treatment must happen in a hospital. Treatment may include:  Medicines given by mouth or through an IV tube.  Being given extra oxygen.  Respiratory therapy. In rare cases, treatment for very bad pneumonia may include:  Using a machine to help you breathe.  Having a procedure to remove fluid from around your lungs. Follow these instructions at home: Medicines  Take over-the-counter and prescription medicines only as told by your doctor. ? Only take cough medicine if you are losing sleep.  If you were prescribed an antibiotic medicine, take it as told by your doctor. Do not stop taking the antibiotic even if you start to feel better. General instructions   Sleep with your head and neck raised (elevated). You can do this by sleeping in a recliner or by putting a few pillows under your head.  Rest as needed. Get at least 8 hours of sleep each night.  Drink enough water to keep your pee (urine) pale yellow.  Eat a healthy diet that includes plenty of vegetables, fruits, whole grains, low-fat dairy products, and lean protein.  Do not use any products that contain nicotine or tobacco. These include cigarettes, e-cigarettes, and chewing tobacco. If you need help quitting, ask your doctor.  Keep all follow-up visits as told by your doctor. This is important. How is this prevented? A shot (vaccine) can help prevent pneumonia. Shots are often suggested for:  People older than 35 years of age.  People older than 35 years of age who: ? Are having cancer treatment. ? Have long-term (chronic) lung disease. ? Have problems with their body's  defense system. You may also prevent pneumonia if you take these actions:  Get the flu (influenza) shot every year.  Go to the dentist as often as told.  Wash your hands often. If you cannot use soap and water, use hand sanitizer. Contact a doctor if:  You have a fever.  You lose sleep because your cough medicine does not help. Get help right away if:  You are short of breath and it gets worse.  You have more chest pain.  Your sickness gets worse. This is very serious if: ? You are an older adult. ? Your body's defense system is weak.  You cough  up blood. Summary  Pneumonia is an infection of the lungs.  Most adults can be treated at home. Some will need treatment in a hospital.  Drink enough water to keep your pee pale yellow.  Get at least 8 hours of sleep each night. This information is not intended to replace advice given to you by your health care provider. Make sure you discuss any questions you have with your health care provider. Document Revised: 08/31/2018 Document Reviewed: 01/06/2018 Elsevier Patient Education  [place].

## 2019-10-21 NOTE — Discharge Summary (Signed)
Physician Discharge Summary  Kenneth Mccann BMW:[id number] DOB: [birth year] DOA: 10/18/2019  PCP: Patient, No Pcp Per  Admit date: 10/18/2019 Discharge date: 10/21/2019  Time spent: 45 minutes  Recommendations for Outpatient Follow-up:  Patient will be discharged to home.  Patient will need to follow up with primary care provider within one week of discharge.  Patient should continue medications as prescribed.  Patient should follow a regular diet.   Discharge Diagnoses:  Acute respiratory failure with hypoxia Elevated D-dimer Acute kidney injury Alcohol intoxication Cocaine use Headache  Discharge Condition: stable  Diet recommendation: regualr  There were no vitals filed for this visit.  History of present illness:  On 10/19/2019 by Dr. Shauna Hugh 34 year old male with no past medical history presents to [facility] emergency department brought in by ambulance after being found lying face down in his bathroom by a friend.  Patient is a poor historian secondary to lethargy secondary to polysubstance abuse. Patient states that he was drinking alcohol yesterday, reporting drinking at least two 40 ounce bottles ofbeer but is unsure. Patient also endorses cocaine use but is uncertain as to how much. Patient denies any symptoms at the ordinary prior to his binge drinking and substance use. Patient denies chest pain, shortness of breath, cough, fevers, nausea, vomiting, abdominal pain, diarrhea, recent travel, sick contacts or confirmed contact with COVID-19.  Upon evaluation in the emergency department patient was initially found to be extremely lethargic and minimally responsive feel to be secondary to polysubstance use. Serum alcohol level found to be 179 with elevated creatinine of 1.8 and leukocytosis of 16.1. Patient was also found to be exhibiting an oxygen requirement requiring up to 4 L of oxygen via nasal cannula and exhibiting several bouts of hypoxia whenever  attempting to wean the patient. Due to persisting hypoxemia and polysubstance use the hospitalist group was called to assess the patient for admission the hospital.  Hospital Course:  Acute respiratory failure with hypoxia -Patient was noted to have oxygen saturations in the 80s and was placed on supplemental oxygen -Suspect secondary to his polysubstance abuse.  Patient was found down in his apartment. -Chest x-ray was unremarkable for active disease. -CTA chest: There for PE.  Multifocal pneumonia with greatest degree of airspace opacity left lower lobe, to be in the right lower lobe as well as posterior segment right upper lobe. -COVID-19 negative -Patient was placed on ceftriaxone, azithromycin- will discharge with Augmentin -Blood cultures show no growth to date -Will attempt to wean supplemental oxygen as possible -Echocardiogram: EF 60 to 65%, LV diastolic parameters were normal -Patient maintaining oxygen saturations on room air at rest and with ambulation in the high 90s.  Has been weaned off of supplemental oxygen.   -PT consulted, no further physical therapy needs.  Elevated D-dimer -CTA chest as above, negative for PE  Acute kidney injury -Creatinine on admission 1.88, with no prior laboratory data for comparison -Suspect secondary to volume depletion -Creatinine down to 0.89  Alcohol intoxication -Patient endorsed heavy alcohol use prior to admission.  He was found collapsed in his apartment. -was placed on CIWA protocol, thiamine, multivitamin, folate -Discussed cessation  Cocaine use -UDS positive for cocaine  Headache -was placed on fioricet  Procedures: Echocardiogram  Consultations: None  Discharge Exam: Vitals:   10/20/19 2000 10/21/19 0728  BP: (!) 148/94 (!) 143/91  Pulse: 93 82  Resp: (!) 32 19  Temp: 98.5 F (36.9 C) 98.2 F (36.8 C)  SpO2: 94% 96%  General: Well developed, well nourished, NAD, appears stated age  HEENT: NCAT,  mucous membranes moist.  Extremities: warm dry without cyanosis clubbing or edema  Neuro: AAOx3, nonfocal   Psych: Appropriate mood and affect  Discharge Instructions Discharge Instructions    Discharge instructions   Complete by: As directed    Patient will be discharged to home.  Patient will need to follow up with primary care provider within one week of discharge.  Patient should continue medications as prescribed.  Patient should follow a regular diet.     Allergies as of 10/21/2019   No Known Allergies     Medication List    TAKE these medications   amoxicillin-clavulanate 875-125 MG tablet Commonly known as: Augmentin Take 1 tablet by mouth 2 (two) times daily for 5 days.   folic acid 1 MG tablet Commonly known as: FOLVITE Take 1 tablet (1 mg total) by mouth daily. Start taking on: Oct 22, 2019   multivitamin with minerals Tabs tablet Take 1 tablet by mouth daily. Start taking on: Oct 22, 2019   thiamine 100 MG tablet Take 1 tablet (100 mg total) by mouth daily. Start taking on: Oct 22, 2019      No Known Allergies Follow-up Information    [facility] on 11/07/2019.   Why: 9:30am for hosptial followup Contact information: [place] [id number] [phone]           The results of significant diagnostics from this hospitalization (including imaging, microbiology, ancillary and laboratory) are listed below for reference.    Significant Diagnostic Studies: CT Head Wo Contrast  Result Date: 10/18/2019 CLINICAL DATA:  Suspected drug overdose. Found unconscious. EXAM: CT HEAD WITHOUT CONTRAST CT CERVICAL SPINE WITHOUT CONTRAST TECHNIQUE: Multidetector CT imaging of the head and cervical spine was performed following the standard protocol without intravenous contrast. Multiplanar CT image reconstructions of the cervical spine were also generated. COMPARISON:  None. FINDINGS: CT  HEAD FINDINGS Brain: There is no mass, hemorrhage or extra-axial collection. The size and configuration of the ventricles and extra-axial CSF spaces are normal. The brain parenchyma is normal, without evidence of acute or chronic infarction. Vascular: No abnormal hyperdensity of the major intracranial arteries or dural venous sinuses. No intracranial atherosclerosis. Skull: The visualized skull base, calvarium and extracranial soft tissues are normal. Sinuses/Orbits: No fluid levels or advanced mucosal thickening of the visualized paranasal sinuses. No mastoid or middle ear effusion. The orbits are normal. CT CERVICAL SPINE FINDINGS Alignment: No static subluxation. Facets are aligned. Occipital condyles are normally positioned. Skull base and vertebrae: No acute fracture. C5-6 fusion. Soft tissues and spinal canal: No prevertebral fluid or swelling. No visible canal hematoma. Disc levels: No advanced spinal canal or neural foraminal stenosis. Upper chest: No pneumothorax, pulmonary nodule or pleural effusion. Other: Normal visualized paraspinal cervical soft tissues. IMPRESSION: 1. No acute intracranial abnormality. 2. No acute fracture or static subluxation of the cervical spine. Electronically Signed   By: Ulyses Jarred M.D.   On: 10/18/2019 21:08   CT ANGIO CHEST PE W OR WO CONTRAST  Result Date: 10/19/2019 CLINICAL DATA:  Shortness of breath and lethargy EXAM: CT ANGIOGRAPHY CHEST WITH CONTRAST TECHNIQUE: Multidetector CT imaging of the chest was performed using the standard protocol during bolus administration of intravenous contrast. Multiplanar CT image reconstructions and MIPs were obtained to evaluate the vascular anatomy. CONTRAST:  84mL OMNIPAQUE IOHEXOL 350 MG/ML SOLN COMPARISON:  Chest radiograph Oct 18, 2019 FINDINGS:  Cardiovascular: There is no demonstrable pulmonary embolus. There is no thoracic aortic aneurysm or dissection. Visualized great vessels appear normal. No appreciable pericardial  effusion or pericardial thickening. Mediastinum/Nodes: Thyroid appears unremarkable. There is no appreciable thoracic adenopathy. Moderate air is noted in the esophagus. No focal esophageal lesions are evident by CT. Lungs/Pleura: There is airspace opacity with consolidation throughout much of the left lower lobe. More patchy infiltrate is noted in portions of the posterior and superior segments of the right lower lobe. There is ill-defined patchy opacity in the posterior segment right upper lobe. No appreciable pleural effusions are evident. Upper Abdomen: Visualized upper abdominal structures appear unremarkable. Musculoskeletal: No blastic or lytic bone lesions. No chest wall lesions evident. Review of the MIP images confirms the above findings. IMPRESSION: 1. No demonstrable pulmonary embolus. No thoracic aortic aneurysm or dissection. 2. Multifocal pneumonia with the greatest degree of airspace opacity in the left lower lobe. Areas of airspace opacity also noted in the right lower lobe and posterior segment right upper lobe. Correlation with COVID-19 status may be advisable given these findings. 3.  No evident adenopathy. Electronically Signed   By: Bretta Bang III M.D.   On: 10/19/2019 07:59   CT Cervical Spine Wo Contrast  Result Date: 10/18/2019 CLINICAL DATA:  Suspected drug overdose. Found unconscious. EXAM: CT HEAD WITHOUT CONTRAST CT CERVICAL SPINE WITHOUT CONTRAST TECHNIQUE: Multidetector CT imaging of the head and cervical spine was performed following the standard protocol without intravenous contrast. Multiplanar CT image reconstructions of the cervical spine were also generated. COMPARISON:  None. FINDINGS: CT HEAD FINDINGS Brain: There is no mass, hemorrhage or extra-axial collection. The size and configuration of the ventricles and extra-axial CSF spaces are normal. The brain parenchyma is normal, without evidence of acute or chronic infarction. Vascular: No abnormal hyperdensity of the  major intracranial arteries or dural venous sinuses. No intracranial atherosclerosis. Skull: The visualized skull base, calvarium and extracranial soft tissues are normal. Sinuses/Orbits: No fluid levels or advanced mucosal thickening of the visualized paranasal sinuses. No mastoid or middle ear effusion. The orbits are normal. CT CERVICAL SPINE FINDINGS Alignment: No static subluxation. Facets are aligned. Occipital condyles are normally positioned. Skull base and vertebrae: No acute fracture. C5-6 fusion. Soft tissues and spinal canal: No prevertebral fluid or swelling. No visible canal hematoma. Disc levels: No advanced spinal canal or neural foraminal stenosis. Upper chest: No pneumothorax, pulmonary nodule or pleural effusion. Other: Normal visualized paraspinal cervical soft tissues. IMPRESSION: 1. No acute intracranial abnormality. 2. No acute fracture or static subluxation of the cervical spine. Electronically Signed   By: Deatra Robinson M.D.   On: 10/18/2019 21:08   DG Chest Port 1 View  Result Date: 10/18/2019 CLINICAL DATA:  Altered EXAM: PORTABLE CHEST 1 VIEW COMPARISON:  None. FINDINGS: The heart size and mediastinal contours are within normal limits. Both lungs are clear. The visualized skeletal structures are unremarkable. IMPRESSION: No active disease. Electronically Signed   By: Jasmine Pang M.D.   On: 10/18/2019 19:21   ECHOCARDIOGRAM COMPLETE  Result Date: 10/19/2019    ECHOCARDIOGRAM REPORT   Patient Name:   KORDE JEPPSEN Date of Exam: 10/19/2019 Medical Rec #:  [id number]        Height:       68.0 in Accession #:    [id number]       Weight:       135.0 lb Date of Birth:  [birth year]         BSA:  1.729 m Patient Age:    33 years         BP:           125/80 mmHg Patient Gender: M                HR:           93 bpm. Exam Location:  Inpatient Procedure: 2D Echo Indications:    Syncope 780.2 / R55  History:        Patient has no prior history of Echocardiogram examinations.                  Cocaine use                 Acute kidney injury                 Alcoholic intoxication                 Acute respiratory failure.  Sonographer:    Leeroy Bockhelsea Turrentine Referring Phys: [id number] Deno LungerGEORGE J SHALHOUB IMPRESSIONS  1. Left ventricular ejection fraction, by estimation, is 60 to 65%. The left ventricle has normal function. The left ventricle has no regional wall motion abnormalities. Left ventricular diastolic parameters were normal.  2. Right ventricular systolic function is normal. The right ventricular size is normal. There is normal pulmonary artery systolic pressure.  3. The mitral valve is normal in structure. No evidence of mitral valve regurgitation. No evidence of mitral stenosis.  4. The aortic valve is normal in structure. Aortic valve regurgitation is not visualized. No aortic stenosis is present.  5. The inferior vena cava is normal in size with greater than 50% respiratory variability, suggesting right atrial pressure of 3 mmHg. Conclusion(s)/Recommendation(s): Normal biventricular function without evidence of hemodynamically significant valvular heart disease. FINDINGS  Left Ventricle: Left ventricular ejection fraction, by estimation, is 60 to 65%. The left ventricle has normal function. The left ventricle has no regional wall motion abnormalities. The left ventricular internal cavity size was normal in size. There is  no left ventricular hypertrophy. Left ventricular diastolic parameters were normal. Right Ventricle: The right ventricular size is normal. No increase in right ventricular wall thickness. Right ventricular systolic function is normal. There is normal pulmonary artery systolic pressure. The tricuspid regurgitant velocity is 2.55 m/s, and  with an assumed right atrial pressure of 3 mmHg, the estimated right ventricular systolic pressure is 29.0 mmHg. Left Atrium: Left atrial size was normal in size. Right Atrium: Right atrial size was normal in size. Pericardium: There is no  evidence of pericardial effusion. Mitral Valve: The mitral valve is normal in structure. Normal mobility of the mitral valve leaflets. No evidence of mitral valve regurgitation. No evidence of mitral valve stenosis. Tricuspid Valve: The tricuspid valve is normal in structure. Tricuspid valve regurgitation is not demonstrated. No evidence of tricuspid stenosis. Aortic Valve: The aortic valve is normal in structure. Aortic valve regurgitation is not visualized. No aortic stenosis is present. Pulmonic Valve: The pulmonic valve was normal in structure. Pulmonic valve regurgitation is not visualized. No evidence of pulmonic stenosis. Aorta: The aortic root is normal in size and structure. Venous: The inferior vena cava is normal in size with greater than 50% respiratory variability, suggesting right atrial pressure of 3 mmHg. IAS/Shunts: No atrial level shunt detected by color flow Doppler.  LEFT VENTRICLE PLAX 2D LVIDd:         4.60 cm  Diastology LVIDs:  3.00 cm  LV e' lateral:   10.80 cm/s LV PW:         0.90 cm  LV E/e' lateral: 7.7 LV IVS:        0.90 cm  LV e' medial:    10.90 cm/s LVOT diam:     1.80 cm  LV E/e' medial:  7.6 LV SV:         49 LV SV Index:   28 LVOT Area:     2.54 cm  RIGHT VENTRICLE RV S prime:     16.30 cm/s TAPSE (M-mode): 2.5 cm LEFT ATRIUM             Index       RIGHT ATRIUM           Index LA diam:        2.70 cm 1.56 cm/m  RA Area:     15.20 cm LA Vol (A2C):   31.2 ml 18.04 ml/m RA Volume:   40.30 ml  23.30 ml/m LA Vol (A4C):   29.5 ml 17.06 ml/m LA Biplane Vol: 30.5 ml 17.64 ml/m  AORTIC VALVE LVOT Vmax:   114.00 cm/s LVOT Vmean:  85.900 cm/s LVOT VTI:    0.193 m  AORTA Ao Root diam: 2.70 cm MITRAL VALVE               TRICUSPID VALVE MV Area (PHT): 4.36 cm    TR Peak grad:   26.0 mmHg MV Decel Time: 174 msec    TR Vmax:        255.00 cm/s MV E velocity: 83.10 cm/s MV A velocity: 60.80 cm/s  SHUNTS MV E/A ratio:  1.37        Systemic VTI:  0.19 m                             Systemic Diam: 1.80 cm Zoila Shutter MD Electronically signed by Zoila Shutter MD Signature Date/Time: 10/19/2019/2:05:27 PM    Final     Microbiology: Recent Results (from the past 240 hour(s))  SARS Coronavirus 2 by RT PCR (hospital order, performed in [facility] hospital lab) Nasopharyngeal Nasopharyngeal Swab     Status: None   Collection Time: 10/19/19 12:45 AM   Specimen: Nasopharyngeal Swab  Result Value Ref Range Status   SARS Coronavirus 2 NEGATIVE NEGATIVE Final    Comment: (NOTE) SARS-CoV-2 target nucleic acids are NOT DETECTED. The SARS-CoV-2 RNA is generally detectable in upper and lower respiratory specimens during the acute phase of infection. The lowest concentration of SARS-CoV-2 viral copies this assay can detect is 250 copies / mL. A negative result does not preclude SARS-CoV-2 infection and should not be used as the sole basis for treatment or other patient management decisions.  A negative result may occur with improper specimen collection / handling, submission of specimen other than nasopharyngeal swab, presence of viral mutation(s) within the areas targeted by this assay, and inadequate number of viral copies (<250 copies / mL). A negative result must be combined with clinical observations, patient history, and epidemiological information. Fact Sheet for Patients:   BoilerBrush.com.cy Fact Sheet for Healthcare Providers: https://pope.com/ This test is not yet approved or cleared  by the [place] FDA and has been authorized for detection and/or diagnosis of SARS-CoV-2 by FDA under an Emergency Use Authorization (EUA).  This EUA will remain in effect (meaning this test can be used) for the duration of the COVID-19  declaration under Section 564(b)(1) of the Act, 21 U.S.C. section 360bbb-3(b)(1), unless the authorization is terminated or revoked sooner. Performed at [facility], [address],  [place], [place] [address]   Culture, blood (routine x 2)     Status: None (Preliminary result)   Collection Time: 10/19/19  6:30 AM   Specimen: BLOOD LEFT ARM  Result Value Ref Range Status   Specimen Description BLOOD LEFT ARM  Final   Special Requests   Final    BOTTLES DRAWN AEROBIC AND ANAEROBIC Blood Culture adequate volume   Culture   Final    NO GROWTH 2 DAYS Performed at [facility], [address], [place], [place] [address]    Report Status PENDING  Incomplete  Culture, blood (routine x 2)     Status: None (Preliminary result)   Collection Time: 10/19/19  6:30 AM   Specimen: BLOOD LEFT ARM  Result Value Ref Range Status   Specimen Description BLOOD LEFT ARM  Final   Special Requests   Final    BOTTLES DRAWN AEROBIC AND ANAEROBIC Blood Culture adequate volume   Culture   Final    NO GROWTH 2 DAYS Performed at [facility], [address], [place], [place] [address]    Report Status PENDING  Incomplete     Labs: Basic Metabolic Panel: Recent Labs  Lab 10/18/19 1910 10/19/19 0647 10/19/19 1144 10/20/19 0303  NA 139 135 138 137  K 3.8 4.4 4.4 3.6  CL 103  --  103 98  CO2 16*  --  23 28  GLUCOSE 193*  --  113* 112*  BUN 11  --  10 <5*  CREATININE 1.88*  --  0.90 0.89  CALCIUM 8.7*  --  8.6* 9.2  MG  --   --  1.7 1.9  PHOS  --   --  3.1  --    Liver Function Tests: Recent Labs  Lab 10/19/19 1144 10/20/19 0303  AST 66* 49*  ALT 49* 40  ALKPHOS 63 62  BILITOT 1.3* 1.1  PROT 7.3 6.2*  ALBUMIN 4.3 3.5   No results for input(s): LIPASE, AMYLASE in the last 168 hours. No results for input(s): AMMONIA in the last 168 hours. CBC: Recent Labs  Lab 10/18/19 1910 10/19/19 0647 10/19/19 1144 10/20/19 0303 10/20/19 1324  WBC 16.1*  --  17.9* 12.3*  --   NEUTROABS 13.9*  --   --  10.1*  --   HGB 14.3 13.9 12.8* 11.6* 11.7*  HCT 45.1 41.0 40.2 35.3* 35.5*  MCV 101.1*  --  100.8* 97.5  --   PLT 214  --  220 193  --    Cardiac Enzymes: Recent  Labs  Lab 10/19/19 0611  CKTOTAL 376   BNP: BNP (last 3 results) No results for input(s): BNP in the last 8760 hours.  ProBNP (last 3 results) No results for input(s): PROBNP in the last 8760 hours.  CBG: No results for input(s): GLUCAP in the last 168 hours.     Signed:  Edsel Petrin  [facility] 10/21/2019, 10:59 AM

## 2019-10-24 LAB — CULTURE, BLOOD (ROUTINE X 2)
Culture: NO GROWTH
Culture: NO GROWTH
Special Requests: ADEQUATE
Special Requests: ADEQUATE

## 2019-10-28 ENCOUNTER — Emergency Department (HOSPITAL_COMMUNITY): Payer: Self-pay

## 2019-10-28 ENCOUNTER — Inpatient Hospital Stay (HOSPITAL_COMMUNITY)
Admission: EM | Admit: 2019-10-28 | Discharge: 2019-10-31 | DRG: 432 | Disposition: A | Discharge status: Home / Self Care | Payer: Self-pay | Attending: Internal Medicine | Admitting: Internal Medicine

## 2019-10-28 ENCOUNTER — Other Ambulatory Visit: Payer: Self-pay

## 2019-10-28 ENCOUNTER — Encounter (HOSPITAL_COMMUNITY): Payer: Self-pay | Admitting: Emergency Medicine

## 2019-10-28 DIAGNOSIS — F1410 Cocaine abuse, uncomplicated: Secondary | ICD-10-CM | POA: Diagnosis present

## 2019-10-28 DIAGNOSIS — K709 Alcoholic liver disease, unspecified: Principal | ICD-10-CM | POA: Diagnosis present

## 2019-10-28 DIAGNOSIS — F1910 Other psychoactive substance abuse, uncomplicated: Secondary | ICD-10-CM | POA: Diagnosis present

## 2019-10-28 DIAGNOSIS — Z20822 Contact with and (suspected) exposure to covid-19: Secondary | ICD-10-CM | POA: Diagnosis present

## 2019-10-28 DIAGNOSIS — D72829 Elevated white blood cell count, unspecified: Secondary | ICD-10-CM

## 2019-10-28 DIAGNOSIS — G9341 Metabolic encephalopathy: Secondary | ICD-10-CM | POA: Diagnosis present

## 2019-10-28 DIAGNOSIS — N179 Acute kidney failure, unspecified: Secondary | ICD-10-CM

## 2019-10-28 DIAGNOSIS — F10120 Alcohol abuse with intoxication, uncomplicated: Secondary | ICD-10-CM | POA: Diagnosis present

## 2019-10-28 DIAGNOSIS — R7989 Other specified abnormal findings of blood chemistry: Secondary | ICD-10-CM

## 2019-10-28 DIAGNOSIS — E162 Hypoglycemia, unspecified: Secondary | ICD-10-CM

## 2019-10-28 DIAGNOSIS — F1490 Cocaine use, unspecified, uncomplicated: Secondary | ICD-10-CM | POA: Diagnosis present

## 2019-10-28 DIAGNOSIS — K7200 Acute and subacute hepatic failure without coma: Secondary | ICD-10-CM | POA: Diagnosis present

## 2019-10-28 DIAGNOSIS — R7401 Elevation of levels of liver transaminase levels: Secondary | ICD-10-CM | POA: Diagnosis present

## 2019-10-28 DIAGNOSIS — G92 Toxic encephalopathy: Secondary | ICD-10-CM | POA: Diagnosis present

## 2019-10-28 DIAGNOSIS — Y901 Blood alcohol level of 20-39 mg/100 ml: Secondary | ICD-10-CM | POA: Diagnosis present

## 2019-10-28 LAB — CBC WITH DIFFERENTIAL/PLATELET
Abs Immature Granulocytes: 0.61 10*3/uL — ABNORMAL HIGH (ref 0.00–0.07)
Basophils Absolute: 0.1 10*3/uL (ref 0.0–0.1)
Basophils Relative: 0 %
Eosinophils Absolute: 0 10*3/uL (ref 0.0–0.5)
Eosinophils Relative: 0 %
HCT: 42.4 % (ref 39.0–52.0)
Hemoglobin: 13.8 g/dL (ref 13.0–17.0)
Immature Granulocytes: 3 %
Lymphocytes Relative: 5 %
Lymphs Abs: 1 10*3/uL (ref 0.7–4.0)
MCH: 31.9 pg (ref 26.0–34.0)
MCHC: 32.5 g/dL (ref 30.0–36.0)
MCV: 98.1 fL (ref 80.0–100.0)
Monocytes Absolute: 0.8 10*3/uL (ref 0.1–1.0)
Monocytes Relative: 4 %
Neutro Abs: 17.4 10*3/uL — ABNORMAL HIGH (ref 1.7–7.7)
Neutrophils Relative %: 88 %
Platelets: 419 10*3/uL — ABNORMAL HIGH (ref 150–400)
RBC: 4.32 MIL/uL (ref 4.22–5.81)
RDW: 14.7 % (ref 11.5–15.5)
WBC: 19.8 10*3/uL — ABNORMAL HIGH (ref 4.0–10.5)
nRBC: 0 % (ref 0.0–0.2)

## 2019-10-28 LAB — COMPREHENSIVE METABOLIC PANEL
ALT: 422 U/L — ABNORMAL HIGH (ref 0–44)
AST: 2912 U/L — ABNORMAL HIGH (ref 15–41)
Albumin: 4.7 g/dL (ref 3.5–5.0)
Alkaline Phosphatase: 144 U/L — ABNORMAL HIGH (ref 38–126)
Anion gap: 17 — ABNORMAL HIGH (ref 5–15)
BUN: 11 mg/dL (ref 6–20)
CO2: 22 mmol/L (ref 22–32)
Calcium: 9.1 mg/dL (ref 8.9–10.3)
Chloride: 98 mmol/L (ref 98–111)
Creatinine, Ser: 1.34 mg/dL — ABNORMAL HIGH (ref 0.61–1.24)
GFR calc Af Amer: >60 mL/min (ref >60)
GFR calc non Af Amer: >60 mL/min (ref >60)
Glucose, Bld: 58 mg/dL — ABNORMAL LOW (ref 70–99)
Potassium: 4.1 mmol/L (ref 3.5–5.1)
Sodium: 137 mmol/L (ref 135–145)
Total Bilirubin: 0.6 mg/dL (ref 0.3–1.2)
Total Protein: 8.5 g/dL — ABNORMAL HIGH (ref 6.5–8.1)

## 2019-10-28 LAB — URINALYSIS, ROUTINE W REFLEX MICROSCOPIC
Bilirubin Urine: NEGATIVE
Glucose, UA: 150 mg/dL — AB
Ketones, ur: NEGATIVE mg/dL
Leukocytes,Ua: NEGATIVE
Nitrite: NEGATIVE
Protein, ur: 100 mg/dL — AB
Specific Gravity, Urine: 1.016 (ref 1.005–1.030)
pH: 5 (ref 5.0–8.0)

## 2019-10-28 LAB — RAPID URINE DRUG SCREEN, HOSP PERFORMED
Amphetamines: NOT DETECTED
Barbiturates: POSITIVE — AB
Benzodiazepines: NOT DETECTED
Cocaine: POSITIVE — AB
Opiates: NOT DETECTED
Tetrahydrocannabinol: POSITIVE — AB

## 2019-10-28 LAB — LIPASE, BLOOD: Lipase: 37 U/L (ref 11–51)

## 2019-10-28 LAB — SALICYLATE LEVEL: Salicylate Lvl: <7 mg/dL — ABNORMAL LOW (ref 7.0–30.0)

## 2019-10-28 LAB — HEPATITIS PANEL, ACUTE
HCV Ab: NONREACTIVE
Hep A IgM: NONREACTIVE
Hep B C IgM: NONREACTIVE
Hepatitis B Surface Ag: NONREACTIVE

## 2019-10-28 LAB — CBG MONITORING, ED
Glucose-Capillary: 72 mg/dL (ref 70–99)
Glucose-Capillary: 59 mg/dL — ABNORMAL LOW (ref 70–99)
Glucose-Capillary: 195 mg/dL — ABNORMAL HIGH (ref 70–99)
Glucose-Capillary: 104 mg/dL — ABNORMAL HIGH (ref 70–99)

## 2019-10-28 LAB — CK: Total CK: 401 U/L — ABNORMAL HIGH (ref 49–397)

## 2019-10-28 LAB — ACETAMINOPHEN LEVEL: Acetaminophen (Tylenol), Serum: <10 ug/mL — ABNORMAL LOW (ref 10–30)

## 2019-10-28 LAB — ETHANOL: Alcohol, Ethyl (B): 20 mg/dL — ABNORMAL HIGH (ref <10)

## 2019-10-28 LAB — SARS CORONAVIRUS 2 BY RT PCR (HOSPITAL ORDER, PERFORMED IN ~~LOC~~ HOSPITAL LAB): SARS Coronavirus 2: NEGATIVE

## 2019-10-28 LAB — PROTIME-INR
INR: 1 (ref 0.8–1.2)
Prothrombin Time: 13.2 seconds (ref 11.4–15.2)

## 2019-10-28 MED ORDER — THIAMINE HCL 100 MG/ML IJ SOLN
100.0000 mg | Freq: Once | INTRAMUSCULAR | Status: AC
  Administered 2019-10-28: 100 mg via INTRAVENOUS
  Filled 2019-10-28: qty 2

## 2019-10-28 MED ORDER — SODIUM CHLORIDE 0.9 % IV BOLUS (SEPSIS)
1000.0000 mL | Freq: Once | INTRAVENOUS | Status: AC
  Administered 2019-10-28: 1000 mL via INTRAVENOUS

## 2019-10-28 MED ORDER — DEXTROSE 50 % IV SOLN
25.0000 mL | Freq: Once | INTRAVENOUS | Status: AC
  Administered 2019-10-28: 25 mL via INTRAVENOUS
  Filled 2019-10-28: qty 50

## 2019-10-28 MED ORDER — ENOXAPARIN SODIUM 40 MG/0.4ML ~~LOC~~ SOLN
40.0000 mg | SUBCUTANEOUS | Status: DC
  Administered 2019-10-28 – 2019-10-31 (×4): 40 mg via SUBCUTANEOUS
  Filled 2019-10-28 (×4): qty 0.4

## 2019-10-28 NOTE — ED Notes (Signed)
Patient provided with orange juice  

## 2019-10-28 NOTE — ED Notes (Signed)
Urine culture sent down to lab with urinalysis. 

## 2019-10-28 NOTE — ED Provider Notes (Signed)
Care transferred to me.  Right upper quadrant ultrasound is unremarkable.  His labs do show a significant AST elevation of almost 3000.  Minimal ALT and alk phos elevations.  Upon talking to patient, he does drink multiple alcoholic drinks per day.  Occasionally does cocaine and there was possible opiates today as he did respond to Narcan.  No abdominal complaints or recent illness such as diarrhea.  He states he took 2 Tylenol yesterday but prior to that or in addition to that he has rarely taken Tylenol and never has taken a lot of Tylenol.  Acetaminophen level was negative here.  I did discuss with gastroenterology, Dr. Silverio Decamp, who indicates being down such as rhabdomyolysis could also increase the AST and we should check a CK.  We will add on INR.  Will need observation and make sure his LFTs do improve.  Will consult hospitalist for admission.  If they need further gastroenterology recommendations then they should reconsult.   Kenneth Gambler, MD 10/28/19 [phone]

## 2019-10-28 NOTE — ED Triage Notes (Signed)
EMS called out by friend. Friend came home and found patient unresponsive on the couch. Patient unarousable by EMS, given 2mg  IN narcan with positive response. Patient denies ingestion, states he has only ingested alcohol this evening. Patient somnolent, but arousable and able to follow commands.

## 2019-10-28 NOTE — ED Notes (Signed)
Patient ambulated to the restroom without assistance 

## 2019-10-28 NOTE — ED Provider Notes (Signed)
TIME SEEN: 5:26 AM  CHIEF COMPLAINT: Substance abuse, found unresponsive  HPI: Patient is a 34 year old male with history of substance abuse who presents to the emergency department after he was found unresponsive on the couch by his friends.  They were unable to wake him up.  They called EMS.  EMS gave patient Narcan and he became more arousable.  Was just admitted to the hospital and discharged on May 29 for an overdose.  At that time was found to have alcohol, cocaine and marijuana in his system.  He endorses to me that he was drinking alcohol tonight but is unable to tell me if he has been doing any drugs.  He falls asleep quickly during questioning.  ROS: Level 5 caveat due to intoxication  PAST MEDICAL HISTORY/PAST SURGICAL HISTORY:  History reviewed. No pertinent past medical history.  MEDICATIONS:  Prior to Admission medications   Medication Sig Start Date End Date Taking? Authorizing Provider  folic acid (FOLVITE) 1 MG tablet Take 1 tablet (1 mg total) by mouth daily. 10/22/19   Edsel Petrin, DO  Multiple Vitamin (MULTIVITAMIN WITH MINERALS) TABS tablet Take 1 tablet by mouth daily. 10/22/19   Mikhail, Nita Sells, DO  thiamine 100 MG tablet Take 1 tablet (100 mg total) by mouth daily. 10/22/19   Edsel Petrin, DO    ALLERGIES:  No Known Allergies  SOCIAL HISTORY:  Social History   Tobacco Use  . Smoking status: Never Smoker  . Smokeless tobacco: Never Used  Substance Use Topics  . Alcohol use: Yes    FAMILY HISTORY: Family History  Family history unknown: Yes    EXAM: BP 117/79   Pulse (!) 108   Temp 98.1 F (36.7 C)   Resp 18   SpO2 93%  CONSTITUTIONAL: Alert and awake up to voice easily.  Will respond to some questions appropriately but then fall back asleep.  Appears intoxicated. HEAD: Normocephalic, atraumatic EYES: Conjunctivae clear, pupils appear equal, EOM appear intact ENT: normal nose; moist mucous membranes NECK: Supple, normal ROM CARD: Regular  and minimally tachycardic; S1 and S2 appreciated; no murmurs, no clicks, no rubs, no gallops RESP: Normal chest excursion without splinting or tachypnea; breath sounds clear and equal bilaterally; no wheezes, no rhonchi, no rales, no hypoxia or respiratory distress, speaking full sentences ABD/GI: Normal bowel sounds; non-distended; soft, non-tender, no rebound, no guarding, no peritoneal signs, no hepatosplenomegaly BACK:  The back appears normal EXT: Normal ROM in all joints; no deformity noted, no edema; no cyanosis SKIN: Normal color for age and race; warm; no rash on exposed skin NEURO: Moves all extremities equally, no facial asymmetry, normal speech, unable to test gait or sensation at this time PSYCH: The patient's mood and manner are appropriate.   MEDICAL DECISION MAKING: Patient here after he was found unresponsive.  Suspect intoxication.  EKG reviewed/interpreted and shows no acute abnormality.  Blood glucose normal.  Will monitor until clinically sober.  Will obtain labs, urine.  I do not feel at this time he needs head imaging.  Will provide IV fluids, thiamine and monitor for any signs of withdrawal.  Unclear if there was any intentional overdose tonight.  Will check acetaminophen and salicylate levels.  ED PROGRESS: Patient's labs are reviewed.  He has a leukocytosis of 19,000 with left shift.  He is now more awake and denies fevers, cough, vomiting, diarrhea, pain.  Creatinine is also mildly elevated at 1.34.  He is receiving IV fluids.  Blood glucose on his lab work was  58 however his fingerstick was in the 70s.  He is currently drinking orange juice and we will recheck his blood glucose.  His liver function test are coming back elevated but he denies abdominal pain and is nontender in the right upper quadrant on reevaluation.  This could be secondary to substance abuse.  Hepatitis panel has been added.  Tylenol level is negative and alcohol level is 20.  Will obtain RUQ [place].  He tells me  now that he was drinking alcohol and denies any drug use.  States that he was just "chilling" and that this was not an attempt to harm himself.  He has no complaints currently.  Urine pending.  Signed out to Dr. Regenia Skeeter to follow up on urine, [place] results.  I reviewed all nursing notes and pertinent previous records as available.  I have reviewed and interpreted any EKGs, lab and urine results, imaging (as available).     EKG Interpretation  Date/Time:  Saturday October 28 2019 05:28:56 EDT Ventricular Rate:  116 PR Interval:  120 QRS Duration: 90 QT Interval:  362 QTC Calculation: 503 R Axis:   98 Text Interpretation: Sinus tachycardia Possible Left atrial enlargement Rightward axis Borderline ECG No significant change since last tracing Confirmed by Pryor Curia [phone]) on 10/28/2019 5:33:10 AM           Vinetta Bergamo was evaluated in Emergency Department on 10/28/2019 for the symptoms described in the history of present illness. He was evaluated in the context of the global COVID-19 pandemic, which necessitated consideration that the patient might be at risk for infection with the SARS-CoV-2 virus that causes COVID-19. Institutional protocols and algorithms that pertain to the evaluation of patients at risk for COVID-19 are in a state of rapid change based on information released by regulatory bodies including the CDC and federal and state organizations. These policies and algorithms were followed during the patient's care in the ED.      Vinette Crites, Delice Bison, DO 10/28/19 [phone]

## 2019-10-28 NOTE — H&P (Signed)
History and Physical    Kenneth Mccann HYW:[id number] DOB: 31-Mar-1986 DOA: 10/28/2019  PCP: Patient, No Pcp Per   Chief Complaint: Somnolence  HPI: Kenneth Mccann is a 34 y.o. male with medical history significant of polysubstance abuse including cocaine, marijuana, alcohol; well known to the hospital system just discharged last week (10/21/19) for event of toxic encephalopathy found down at home. Patient again found very somnolent overnight/early morning by friends on couch and they were unable to wake him. EMS gave narcan with appropriate response.  On reevaluation this afternoon patient much more awake alert oriented states he snorted what he presumed to be cocaine overnight, subsequently awoke in the hospital with no memory of any events in between  ED Course: In ED patient was somewhat more awake after Narcan, patient's labs were remarkable for elevated CPK, with markedly elevated AST at 3000 ALT and moderately elevated at 400 respectively.  UDS remarkable for cocaine, THC, barbiturates.  Review of Systems: As per HPI patient denies nausea, vomiting, diarrhea, constipation, headache, fevers, chills..   Assessment/Plan Active Problems:   Acute liver failure   Acute liver failure/shock liver in the setting of polypharmacy, alcohol abuse, and likely hypovolemia, POA -UDS positive for barbiturates, THC, cocaine -Admits to snorting what he thought was cocaine last night, we discussed the ease of overdose especially when unknown substances are involved. - Tylenol level WNL - repeat in 4 hours to ensure no recent ingestion and follow curve if abnormal - RUQ US unremarkable, abdomen benign -Hepatitis panel negative  Acute toxic encephalopathy -Likely polypharmacy in nature given UDS positive for cocaine barbiturates THC.  Also admits to drinking alcohol, declines other substance use or abuse over the past 24 hours, again patient does not truly know what he snorted however given improvement  with Narcan there is some concern for opiate/narcotic involvement spite UDS being negative given Dilaudid and fentanyl as well as newer narcotics are not as reliably testable  Polysubstance abuse -Lengthy discussion about need for substance abuse cessation given high likelihood for overdose given patient's recurrent episodes of somnolence and poorly able to arouse quiring hospitalization and Narcan administration  DVT prophylaxis: Lovenox Code Status: Full Family Communication: None present Status is: Observation  Dispo: The patient is from: Home              Anticipated d/c is to: Home              Anticipated d/c date is: 24 to 48 hours pending clinical course              Patient currently not medically stable for discharge due to the need for ongoing close monitoring in the setting of recent overdose, IV fluids and possible reversal agents pending repeat labs and imaging.  Consultants:   None  Procedures:   None   History reviewed. No pertinent past medical history.  History reviewed. No pertinent surgical history.   reports that he has never smoked. He has never used smokeless tobacco. He reports current alcohol use. He reports current drug use. Drug: Cocaine.  No Known Allergies  Family History  Family history unknown: Yes    Prior to Admission medications   Medication Sig Start Date End Date Taking? Authorizing Provider  folic acid (FOLVITE) 1 MG tablet Take 1 tablet (1 mg total) by mouth daily. 10/22/19   Edsel Petrin, DO  Multiple Vitamin (MULTIVITAMIN WITH MINERALS) TABS tablet Take 1 tablet by mouth daily. 10/22/19   Edsel Petrin, DO  thiamine 100 MG  tablet Take 1 tablet (100 mg total) by mouth daily. 10/22/19   Cristal Ford, DO    Physical Exam: Vitals:   10/28/19 0723 10/28/19 0935 10/28/19 1147 10/28/19 1359  BP: 128/86 (!) 107/96 (!) 135/118 122/83  Pulse: 88 (!) 111 95 89  Resp: 18 18 18 18   Temp:      SpO2: 92% 96% 98% 100%     Constitutional: NAD, calm, comfortable Vitals:   10/28/19 0723 10/28/19 0935 10/28/19 1147 10/28/19 1359  BP: 128/86 (!) 107/96 (!) 135/118 122/83  Pulse: 88 (!) 111 95 89  Resp: 18 18 18 18   Temp:      SpO2: 92% 96% 98% 100%   General:  Pleasantly resting in bed, No acute distress. HEENT:  Normocephalic atraumatic.  Sclerae nonicteric, noninjected.  Extraocular movements intact bilaterally. Neck:  Without mass or deformity.  Trachea is midline. Lungs:  Clear to auscultate bilaterally without rhonchi, wheeze, or rales. Heart:  Regular rate and rhythm.  Without murmurs, rubs, or gallops. Abdomen:  Soft, nontender, nondistended.  Without guarding or rebound. Extremities: Without cyanosis, clubbing, edema, or obvious deformity. Vascular:  Dorsalis pedis and posterior tibial pulses palpable bilaterally. Skin:  Warm and dry, no erythema, no ulcerations.   Labs on Admission: I have personally reviewed following labs and imaging studies  CBC: Recent Labs  Lab 10/28/19 0602  WBC 19.8*  NEUTROABS 17.4*  HGB 13.8  HCT 42.4  MCV 98.1  PLT 433*   Basic Metabolic Panel: Recent Labs  Lab 10/28/19 0602  NA 137  K 4.1  CL 98  CO2 22  GLUCOSE 58*  BUN 11  CREATININE 1.34*  CALCIUM 9.1   GFR: CrCl cannot be calculated (Unknown ideal weight.). Liver Function Tests: Recent Labs  Lab 10/28/19 0602  AST 2,912*  ALT 422*  ALKPHOS 144*  BILITOT 0.6  PROT 8.5*  ALBUMIN 4.7   Recent Labs  Lab 10/28/19 0930  LIPASE 37   No results for input(s): AMMONIA in the last 168 hours. Coagulation Profile: Recent Labs  Lab 10/28/19 1036  INR 1.0   Cardiac Enzymes: Recent Labs  Lab 10/28/19 0930  CKTOTAL 401*   BNP (last 3 results) No results for input(s): PROBNP in the last 8760 hours. HbA1C: No results for input(s): HGBA1C in the last 72 hours. CBG: Recent Labs  Lab 10/28/19 0552 10/28/19 0727 10/28/19 0826 10/28/19 0939  GLUCAP 72 59* 195* 104*   Lipid  Profile: No results for input(s): CHOL, HDL, LDLCALC, TRIG, CHOLHDL, LDLDIRECT in the last 72 hours. Thyroid Function Tests: No results for input(s): TSH, T4TOTAL, FREET4, T3FREE, THYROIDAB in the last 72 hours. Anemia Panel: No results for input(s): VITAMINB12, FOLATE, FERRITIN, TIBC, IRON, RETICCTPCT in the last 72 hours. Urine analysis:    Component Value Date/Time   COLORURINE YELLOW 10/28/2019 0640   APPEARANCEUR CLOUDY (A) 10/28/2019 0640   LABSPEC 1.016 10/28/2019 0640   PHURINE 5.0 10/28/2019 0640   GLUCOSEU 150 (A) 10/28/2019 0640   HGBUR MODERATE (A) 10/28/2019 0640   BILIRUBINUR NEGATIVE 10/28/2019 0640   KETONESUR NEGATIVE 10/28/2019 0640   PROTEINUR 100 (A) 10/28/2019 0640   NITRITE NEGATIVE 10/28/2019 0640   LEUKOCYTESUR NEGATIVE 10/28/2019 0640    Radiological Exams on Admission: US Abdomen Limited RUQ  Result Date: 10/28/2019 CLINICAL DATA:  Elevated liver function test. EXAM: ULTRASOUND ABDOMEN LIMITED RIGHT UPPER QUADRANT COMPARISON:  None. FINDINGS: Gallbladder: No gallstones or wall thickening visualized. No sonographic Murphy sign noted by sonographer. Common bile duct: Diameter: 3  mm Liver: No focal lesion identified. Within normal limits in parenchymal echogenicity. Portal vein is patent on color Doppler imaging with normal direction of blood flow towards the liver. Other: None. IMPRESSION: Normal right upper quadrant ultrasound. Electronically Signed   By: Amie Portland M.D.   On: 10/28/2019 08:24    EKG: Independently reviewed. Sinus tachycardia without ST depressions/elevations.   Azucena Fallen DO [facility]  If 7PM-7AM, please contact night-coverage [email or web address]   10/28/2019, 2:19 PM

## 2019-10-29 DIAGNOSIS — G9341 Metabolic encephalopathy: Secondary | ICD-10-CM | POA: Diagnosis present

## 2019-10-29 DIAGNOSIS — R7401 Elevation of levels of liver transaminase levels: Secondary | ICD-10-CM

## 2019-10-29 DIAGNOSIS — K709 Alcoholic liver disease, unspecified: Secondary | ICD-10-CM | POA: Diagnosis present

## 2019-10-29 DIAGNOSIS — F1910 Other psychoactive substance abuse, uncomplicated: Secondary | ICD-10-CM | POA: Diagnosis present

## 2019-10-29 LAB — COMPREHENSIVE METABOLIC PANEL
ALT: 222 U/L — ABNORMAL HIGH (ref 0–44)
AST: 353 U/L — ABNORMAL HIGH (ref 15–41)
Albumin: 3.6 g/dL (ref 3.5–5.0)
Alkaline Phosphatase: 93 U/L (ref 38–126)
Anion gap: 11 (ref 5–15)
BUN: 9 mg/dL (ref 6–20)
CO2: 24 mmol/L (ref 22–32)
Calcium: 8.8 mg/dL — ABNORMAL LOW (ref 8.9–10.3)
Chloride: 102 mmol/L (ref 98–111)
Creatinine, Ser: 0.95 mg/dL (ref 0.61–1.24)
GFR calc Af Amer: >60 mL/min (ref >60)
GFR calc non Af Amer: >60 mL/min (ref >60)
Glucose, Bld: 100 mg/dL — ABNORMAL HIGH (ref 70–99)
Potassium: 4 mmol/L (ref 3.5–5.1)
Sodium: 137 mmol/L (ref 135–145)
Total Bilirubin: 0.6 mg/dL (ref 0.3–1.2)
Total Protein: 6.6 g/dL (ref 6.5–8.1)

## 2019-10-29 LAB — CBC
HCT: 37.1 % — ABNORMAL LOW (ref 39.0–52.0)
Hemoglobin: 11.9 g/dL — ABNORMAL LOW (ref 13.0–17.0)
MCH: 31 pg (ref 26.0–34.0)
MCHC: 32.1 g/dL (ref 30.0–36.0)
MCV: 96.6 fL (ref 80.0–100.0)
Platelets: 353 10*3/uL (ref 150–400)
RBC: 3.84 MIL/uL — ABNORMAL LOW (ref 4.22–5.81)
RDW: 14.6 % (ref 11.5–15.5)
WBC: 6.7 10*3/uL (ref 4.0–10.5)
nRBC: 0 % (ref 0.0–0.2)

## 2019-10-29 MED ORDER — LORAZEPAM 1 MG PO TABS
1.0000 mg | ORAL_TABLET | ORAL | Status: DC | PRN
  Administered 2019-10-29 – 2019-10-31 (×3): 1 mg via ORAL
  Filled 2019-10-29 (×3): qty 1

## 2019-10-29 MED ORDER — LORAZEPAM 2 MG/ML IJ SOLN
1.0000 mg | INTRAMUSCULAR | Status: DC | PRN
  Administered 2019-10-30: 1 mg via INTRAVENOUS
  Filled 2019-10-29: qty 1

## 2019-10-29 MED ORDER — ADULT MULTIVITAMIN W/MINERALS CH
1.0000 | ORAL_TABLET | Freq: Every day | ORAL | Status: DC
  Administered 2019-10-29 – 2019-10-31 (×3): 1 via ORAL
  Filled 2019-10-29 (×3): qty 1

## 2019-10-29 MED ORDER — FOLIC ACID 1 MG PO TABS
1.0000 mg | ORAL_TABLET | Freq: Every day | ORAL | Status: DC
  Administered 2019-10-29 – 2019-10-31 (×3): 1 mg via ORAL
  Filled 2019-10-29 (×3): qty 1

## 2019-10-29 MED ORDER — THIAMINE HCL 100 MG PO TABS
100.0000 mg | ORAL_TABLET | Freq: Every day | ORAL | Status: DC
  Administered 2019-10-29 – 2019-10-31 (×3): 100 mg via ORAL
  Filled 2019-10-29 (×3): qty 1

## 2019-10-29 NOTE — Progress Notes (Addendum)
PROGRESS NOTE  Kenneth Mccann OYD:[id number] DOB: 10/13/1985 DOA: 10/28/2019 PCP: Patient, No Pcp Per   LOS: 0 days   Brief narrative: As per HPI,  Kenneth Mccann is a 34 y.o. male with medical history significant of polysubstance abuse including cocaine, marijuana, alcohol; well known to the hospital system just discharged last week (10/21/19) for event of toxic encephalopathy, found down at home. Patient again found very somnolent overnight/early morning on the day of presentation by friends on couch and they were unable to wake him. EMS gave narcan with appropriate response.  On reevaluation this afternoon patient much more awake alert oriented states he snorted what he presumed to be cocaine overnight, subsequently awoke in the hospital with no memory of any events in between. ED Course: In ED patient was somewhat more awake after Narcan, patient's labs were remarkable for elevated CPK, with markedly elevated AST at 3000 ALT and moderately elevated at 400 respectively.  UDS remarkable for cocaine, THC, barbiturates.   Assessment/Plan:  Principal Problem:   Transaminitis Active Problems:   Cocaine use   Metabolic encephalopathy   Polysubstance abuse (HCC)   Alcohol induced liver disorder (HCC)  Transaminitis with confusion.  Normal INR.  Could be secondary to polypharmacy alcohol abuse.  Continue IV fluid hydration.  Closely monitor liver function test.  Patient's mentation has improved at this time.  Will trend LFTs.  LFTs have significantly trended down today.  Patient was counseled regarding substance abuse.  We will put the patient on CIWA protocol.  He states that he gets tremors from alcohol withdrawals.  None of the right upper quadrant showed normal findings.. Salicylate and Tylenol levels were low.  Hepatitis panel was negative.  Acute toxic encephalopathy Likely secondary to polysubstance abuse and polypharmacy.  Improved mentation at this time.  Unlikely hepatic  encephalopathy.    Polysubstance abuse/alcohol abuse. Patient is interested in detox at this time.  Will closely monitor.  We will put the patient on CIWA protocol.  VTE Prophylaxis: Lovenox subcu  Code Status: Full code  Family Communication: None  Status is: Inpatient  Remains inpatient appropriate because:Ongoing diagnostic testing needed not appropriate for outpatient work up, Inpatient level of care appropriate due to severity of illness and Monitoring for alcohol withdrawal   Dispo: The patient is from: Home              Anticipated d/c is to: Home              Anticipated d/c date is: 2 days, monitor LFTs, mentation, CIWA protocol              Patient currently is not medically stable to d/c.    Consultants:  None  Procedures:  None  Antibiotics:  . None  Anti-infectives (From admission, onward)   None     Subjective: Today, patient was seen and examined at bedside.  Denies overt withdrawals hallucinations.  Does not remember much of the event yesterday.  Denies any pain, nausea, vomiting.  Does not feel completely well and feels motivated to undergo detox.  Objective: Vitals:   10/28/19 2259 10/29/19 0539  BP: 134/87 122/82  Pulse: 75 69  Resp: 17 17  Temp: 98.2 F (36.8 C) 98.4 F (36.9 C)  SpO2: 100% 100%    Intake/Output Summary (Last 24 hours) at 10/29/2019 1325 Last data filed at 10/29/2019 1233 Gross per 24 hour  Intake 780 ml  Output --  Net 780 ml   There were no vitals filed for  this visit. There is no height or weight on file to calculate BMI.   Physical Exam:  GENERAL: Patient is alert awake and oriented. Not in obvious distress. HENT: No scleral pallor or icterus. Pupils equally reactive to light. Oral mucosa is moist NECK: is supple, no gross swelling noted. CHEST: Clear to auscultation. No crackles or wheezes.  Diminished breath sounds bilaterally. CVS: S1 and S2 heard, no murmur. Regular rate and rhythm.  ABDOMEN: Soft,  non-tender, bowel sounds are present. EXTREMITIES: No edema. CNS: Cranial nerves are intact. No focal motor deficits. SKIN: warm and dry without rashes.  Data Review: I have personally reviewed the following laboratory data and studies,  CBC: Recent Labs  Lab 10/28/19 0602 10/29/19 0403  WBC 19.8* 6.7  NEUTROABS 17.4*  --   HGB 13.8 11.9*  HCT 42.4 37.1*  MCV 98.1 96.6  PLT 419* 353   Basic Metabolic Panel: Recent Labs  Lab 10/28/19 0602 10/29/19 0403  NA 137 137  K 4.1 4.0  CL 98 102  CO2 22 24  GLUCOSE 58* 100*  BUN 11 9  CREATININE 1.34* 0.95  CALCIUM 9.1 8.8*   Liver Function Tests: Recent Labs  Lab 10/28/19 0602 10/29/19 0403  AST 2,912* 353*  ALT 422* 222*  ALKPHOS 144* 93  BILITOT 0.6 0.6  PROT 8.5* 6.6  ALBUMIN 4.7 3.6   Recent Labs  Lab 10/28/19 0930  LIPASE 37   No results for input(s): AMMONIA in the last 168 hours. Cardiac Enzymes: Recent Labs  Lab 10/28/19 0930  CKTOTAL 401*   BNP (last 3 results) No results for input(s): BNP in the last 8760 hours.  ProBNP (last 3 results) No results for input(s): PROBNP in the last 8760 hours.  CBG: Recent Labs  Lab 10/28/19 0552 10/28/19 0727 10/28/19 0826 10/28/19 0939  GLUCAP 72 59* 195* 104*   Recent Results (from the past 240 hour(s))  SARS Coronavirus 2 by RT PCR (hospital order, performed in [facility] hospital lab) Nasopharyngeal Nasopharyngeal Swab     Status: None   Collection Time: 10/28/19  9:30 AM   Specimen: Nasopharyngeal Swab  Result Value Ref Range Status   SARS Coronavirus 2 NEGATIVE NEGATIVE Final    Comment: (NOTE) SARS-CoV-2 target nucleic acids are NOT DETECTED. The SARS-CoV-2 RNA is generally detectable in upper and lower respiratory specimens during the acute phase of infection. The lowest concentration of SARS-CoV-2 viral copies this assay can detect is 250 copies / mL. A negative result does not preclude SARS-CoV-2 infection and should not be used as the sole  basis for treatment or other patient management decisions.  A negative result may occur with improper specimen collection / handling, submission of specimen other than nasopharyngeal swab, presence of viral mutation(s) within the areas targeted by this assay, and inadequate number of viral copies (<250 copies / mL). A negative result must be combined with clinical observations, patient history, and epidemiological information. Fact Sheet for Patients:   BoilerBrush.com.cy Fact Sheet for Healthcare Providers: https://pope.com/ This test is not yet approved or cleared  by the [place] FDA and has been authorized for detection and/or diagnosis of SARS-CoV-2 by FDA under an Emergency Use Authorization (EUA).  This EUA will remain in effect (meaning this test can be used) for the duration of the COVID-19 declaration under Section 564(b)(1) of the Act, 21 U.S.C. section 360bbb-3(b)(1), unless the authorization is terminated or revoked sooner. Performed at [facility], [address], [place], [place] [address]  Studies: US Abdomen Limited RUQ  Result Date: 10/28/2019 CLINICAL DATA:  Elevated liver function test. EXAM: ULTRASOUND ABDOMEN LIMITED RIGHT UPPER QUADRANT COMPARISON:  None. FINDINGS: Gallbladder: No gallstones or wall thickening visualized. No sonographic Murphy sign noted by sonographer. Common bile duct: Diameter: 3 mm Liver: No focal lesion identified. Within normal limits in parenchymal echogenicity. Portal vein is patent on color Doppler imaging with normal direction of blood flow towards the liver. Other: None. IMPRESSION: Normal right upper quadrant ultrasound. Electronically Signed   By: Lajean Manes M.D.   On: 10/28/2019 08:24      Flora Lipps, MD  [facility] 10/29/2019

## 2019-10-29 NOTE — Progress Notes (Signed)
Pt stable at time of bedside rounding with Colin Mulders RN. No needs at time of rounding.

## 2019-10-30 LAB — COMPREHENSIVE METABOLIC PANEL
ALT: 236 U/L — ABNORMAL HIGH (ref 0–44)
AST: 214 U/L — ABNORMAL HIGH (ref 15–41)
Albumin: 3.8 g/dL (ref 3.5–5.0)
Alkaline Phosphatase: 91 U/L (ref 38–126)
Anion gap: 13 (ref 5–15)
BUN: 8 mg/dL (ref 6–20)
CO2: 25 mmol/L (ref 22–32)
Calcium: 9.3 mg/dL (ref 8.9–10.3)
Chloride: 96 mmol/L — ABNORMAL LOW (ref 98–111)
Creatinine, Ser: 0.9 mg/dL (ref 0.61–1.24)
GFR calc Af Amer: >60 mL/min (ref >60)
GFR calc non Af Amer: >60 mL/min (ref >60)
Glucose, Bld: 103 mg/dL — ABNORMAL HIGH (ref 70–99)
Potassium: 3.7 mmol/L (ref 3.5–5.1)
Sodium: 134 mmol/L — ABNORMAL LOW (ref 135–145)
Total Bilirubin: 0.7 mg/dL (ref 0.3–1.2)
Total Protein: 7 g/dL (ref 6.5–8.1)

## 2019-10-30 LAB — CK: Total CK: 322 U/L (ref 49–397)

## 2019-10-30 LAB — MAGNESIUM: Magnesium: 1.9 mg/dL (ref 1.7–2.4)

## 2019-10-30 LAB — PHOSPHORUS: Phosphorus: 3.6 mg/dL (ref 2.5–4.6)

## 2019-10-30 NOTE — Progress Notes (Signed)
PROGRESS NOTE  [name] IRJ:[id number] DOB: 01-24-1986 DOA: 10/28/2019 PCP: Patient, No Pcp Per   LOS: 1 day   Brief narrative: As per HPI,  Kenneth Mccann is a 34 y.o. male with medical history significant of polysubstance abuse including cocaine, marijuana, alcohol; well known to the hospital system just discharged last week (10/21/19) for event of toxic encephalopathy, found down at home. Patient again found very somnolent overnight/early morning on the day of presentation by friends on couch and they were unable to wake him. EMS gave narcan with appropriate response.  On reevaluation this afternoon patient much more awake alert oriented states he snorted what he presumed to be cocaine overnight, subsequently awoke in the hospital with no memory of any events in between. ED Course: In ED patient was somewhat more awake after Narcan, patient's labs were remarkable for elevated CPK, with markedly elevated AST at 3000 ALT and moderately elevated at 400 respectively.  UDS remarkable for cocaine, THC, barbiturates.   Assessment/Plan:  Principal Problem:   Transaminitis Active Problems:   Cocaine use   Metabolic encephalopathy   Polysubstance abuse (HCC)   Alcohol induced liver disorder (HCC)  Transaminitis with confusion.  Normal INR.  Could be secondary to polypharmacy, substance abuse including alcohol abuse.   Liver function tests are trending down.  Mentation back to baseline.  Will trend LFTs.  Continue CIWA protocol.  He states that he gets tremors from alcohol withdrawals.  Ultra sound of the right upper quadrant showed normal findings.. Salicylate and Tylenol levels were low.  Hepatitis panel was negative.  AST of 214 and ALT of 236 today.  Acute toxic encephalopathy Likely secondary to polysubstance abuse and polypharmacy.  Improved mentation at this time.  Unlikely hepatic encephalopathy.    Polysubstance abuse/alcohol abuse. Patient is interested in detox at this time.   Will closely monitor.  Continue patient on CIWA protocol.  VTE Prophylaxis: Lovenox subcu  Code Status: Full code  Family Communication: None  Status is: Inpatient  Remains inpatient appropriate because: Inpatient level of care appropriate due to severity of illness and Monitoring for alcohol withdrawal, CIWA protocol, monitoring of LFTs.   Dispo: The patient is from: Home              Anticipated d/c is to: Home              Anticipated d/c date is: Likely tomorrow if he continues to remain stable and LFTs trending down, monitor LFTs, continue CIWA protocol              Patient currently is not medically stable to d/c.  Consultants:  None  Procedures:  None  Antibiotics:  . None  Anti-infectives (From admission, onward)   None     Subjective: Today, patient was seen and examined at bedside.  Complains of tremors but no hallucinations.  Denies shortness of breath, cough, fever or chills.   feels motivated to undergo detox.  Objective: Vitals:   10/29/19 2146 10/30/19 0534  BP: (!) 141/90 (!) 138/98  Pulse: 69 79  Resp: 17 16  Temp: 98.9 F (37.2 C) 98 F (36.7 C)  SpO2: 100% 100%    Intake/Output Summary (Last 24 hours) at 10/30/2019 1339 Last data filed at 10/30/2019 1000 Gross per 24 hour  Intake 1190 ml  Output 600 ml  Net 590 ml   There were no vitals filed for this visit. There is no height or weight on file to calculate BMI.   Physical Exam:  GENERAL: Patient is alert awake and oriented. Not in obvious distress. HENT: No scleral pallor or icterus. Pupils equally reactive to light. Oral mucosa is moist NECK: is supple, no gross swelling noted. CHEST: Clear to auscultation. No crackles or wheezes.  Diminished breath sounds bilaterally. CVS: S1 and S2 heard, no murmur. Regular rate and rhythm.  ABDOMEN: Soft, non-tender, bowel sounds are present. EXTREMITIES: No edema.  Mild upper extremity tremors CNS: Cranial nerves are intact. No focal motor  deficits. SKIN: warm and dry without rashes.  Data Review: I have personally reviewed the following laboratory data and studies,  CBC: Recent Labs  Lab 10/28/19 0602 10/29/19 0403  WBC 19.8* 6.7  NEUTROABS 17.4*  --   HGB 13.8 11.9*  HCT 42.4 37.1*  MCV 98.1 96.6  PLT 419* 353   Basic Metabolic Panel: Recent Labs  Lab 10/28/19 0602 10/29/19 0403 10/30/19 0501  NA 137 137 134*  K 4.1 4.0 3.7  CL 98 102 96*  CO2 22 24 25   GLUCOSE 58* 100* 103*  BUN 11 9 8   CREATININE 1.34* 0.95 0.90  CALCIUM 9.1 8.8* 9.3  MG  --   --  1.9  PHOS  --   --  3.6   Liver Function Tests: Recent Labs  Lab 10/28/19 0602 10/29/19 0403 10/30/19 0501  AST 2,912* 353* 214*  ALT 422* 222* 236*  ALKPHOS 144* 93 91  BILITOT 0.6 0.6 0.7  PROT 8.5* 6.6 7.0  ALBUMIN 4.7 3.6 3.8   Recent Labs  Lab 10/28/19 0930  LIPASE 37   No results for input(s): AMMONIA in the last 168 hours. Cardiac Enzymes: Recent Labs  Lab 10/28/19 0930 10/30/19 0501  CKTOTAL 401* 322   BNP (last 3 results) No results for input(s): BNP in the last 8760 hours.  ProBNP (last 3 results) No results for input(s): PROBNP in the last 8760 hours.  CBG: Recent Labs  Lab 10/28/19 0552 10/28/19 0727 10/28/19 0826 10/28/19 0939  GLUCAP 72 59* 195* 104*   Recent Results (from the past 240 hour(s))  SARS Coronavirus 2 by RT PCR (hospital order, performed in [facility] hospital lab) Nasopharyngeal Nasopharyngeal Swab     Status: None   Collection Time: 10/28/19  9:30 AM   Specimen: Nasopharyngeal Swab  Result Value Ref Range Status   SARS Coronavirus 2 NEGATIVE NEGATIVE Final    Comment: (NOTE) SARS-CoV-2 target nucleic acids are NOT DETECTED. The SARS-CoV-2 RNA is generally detectable in upper and lower respiratory specimens during the acute phase of infection. The lowest concentration of SARS-CoV-2 viral copies this assay can detect is 250 copies / mL. A negative result does not preclude SARS-CoV-2  infection and should not be used as the sole basis for treatment or other patient management decisions.  A negative result may occur with improper specimen collection / handling, submission of specimen other than nasopharyngeal swab, presence of viral mutation(s) within the areas targeted by this assay, and inadequate number of viral copies (<250 copies / mL). A negative result must be combined with clinical observations, patient history, and epidemiological information. Fact Sheet for Patients:   [facility] Fact Sheet for Healthcare Providers: 12/28/19 This test is not yet approved or cleared  by the [employer] FDA and has been authorized for detection and/or diagnosis of SARS-CoV-2 by FDA under an Emergency Use Authorization (EUA).  This EUA will remain in effect (meaning this test can be used) for the duration of the COVID-19 declaration under Section 564(b)(1) of the Act,  21 U.S.C. section 360bbb-3(b)(1), unless the authorization is terminated or revoked sooner. Performed at [facility], [place] [address], [place], [place] [address]      Studies: No results found.    Flora Lipps, MD  [facility] 10/30/2019

## 2019-10-30 NOTE — TOC Progression Note (Signed)
Transition of Care [facility]) - Progression Note    Patient Details  Name: Kenneth Mccann MRN: [id number] Date of Birth: January 29, 1986  Transition [facility]) CM/SW Contact  Lennart Pall, LCSW Phone Number: 10/30/2019, 3:59 PM  Clinical Narrative:   Met with pt to discuss concerns of another rapid return to hospital due to [place].  He agrees that he is concerned about this as well and is interested in going into an inpatient rehab program.  Explained that our options are very limited as he is uninsured and not a [place] resident (had just started staying with a friend here in [place], however, he is from [place].) With pt agreement, I have contacted local programs and sent his information but I am not optimistic about securing a bed.  Discussed need for him to, at least, begin an outpatient program until he can get into an inpatient setting.  Have provided him with written info on residential and outpatient programs as well as other local support resources.  Anticipate medical readiness for d/c tomorrow.  Will follow back up with pt in the morning.    Expected Discharge Plan: Home/Self Care(vs. inpatient SA rehab) Barriers to Discharge: Continued Medical Work up  Expected Discharge Plan and Services Expected Discharge Plan: Home/Self Care(vs. inpatient SA rehab) In-house Referral: Clinical Social Work                       DME Arranged: N/A DME Agency: NA       HH Arranged: NA HH Agency: NA         Social Determinants of Health (SDOH) Interventions    Readmission Risk Interventions No flowsheet data found.

## 2019-10-31 DIAGNOSIS — K709 Alcoholic liver disease, unspecified: Principal | ICD-10-CM

## 2019-10-31 DIAGNOSIS — F1490 Cocaine use, unspecified, uncomplicated: Secondary | ICD-10-CM

## 2019-10-31 LAB — COMPREHENSIVE METABOLIC PANEL
ALT: 227 U/L — ABNORMAL HIGH (ref 0–44)
AST: 143 U/L — ABNORMAL HIGH (ref 15–41)
Albumin: 4.1 g/dL (ref 3.5–5.0)
Alkaline Phosphatase: 95 U/L (ref 38–126)
Anion gap: 12 (ref 5–15)
BUN: 9 mg/dL (ref 6–20)
CO2: 27 mmol/L (ref 22–32)
Calcium: 10 mg/dL (ref 8.9–10.3)
Chloride: 99 mmol/L (ref 98–111)
Creatinine, Ser: 0.76 mg/dL (ref 0.61–1.24)
GFR calc Af Amer: >60 mL/min (ref >60)
GFR calc non Af Amer: >60 mL/min (ref >60)
Glucose, Bld: 92 mg/dL (ref 70–99)
Potassium: 3.9 mmol/L (ref 3.5–5.1)
Sodium: 138 mmol/L (ref 135–145)
Total Bilirubin: 0.6 mg/dL (ref 0.3–1.2)
Total Protein: 7.6 g/dL (ref 6.5–8.1)

## 2019-10-31 LAB — CBC
HCT: 42.3 % (ref 39.0–52.0)
Hemoglobin: 13.9 g/dL (ref 13.0–17.0)
MCH: 31.4 pg (ref 26.0–34.0)
MCHC: 32.9 g/dL (ref 30.0–36.0)
MCV: 95.5 fL (ref 80.0–100.0)
Platelets: 393 10*3/uL (ref 150–400)
RBC: 4.43 MIL/uL (ref 4.22–5.81)
RDW: 14.6 % (ref 11.5–15.5)
WBC: 6.5 10*3/uL (ref 4.0–10.5)
nRBC: 0 % (ref 0.0–0.2)

## 2019-10-31 LAB — MAGNESIUM: Magnesium: 2 mg/dL (ref 1.7–2.4)

## 2019-10-31 NOTE — Discharge Summary (Signed)
Physician Discharge Summary  Kenneth Mccann GYI:[id number] DOB: 1985-07-14 DOA: 10/28/2019  PCP: Patient, No Pcp Per  Admit date: 10/28/2019 Discharge date: 10/31/2019  Admitted From: Home  Discharge disposition: Home   Recommendations for Outpatient Follow-Up:   . Follow up with your primary care provider as has been scheduled . Check CBC, CMP, INR in the next visit . Patient should be counseled against alcohol and substance abuse.  He was given resources for substance abuse rehabilitation on discharge.   Discharge Diagnosis:   Principal Problem:   Transaminitis Active Problems:   Cocaine use   Metabolic encephalopathy   Polysubstance abuse (HCC)   Alcohol induced liver disorder (HCC)   Discharge Condition: Improved.  Diet recommendation: Regular.  Wound care: None.  Code status: Full.   History of Present Illness:   Kenneth Timmonsis a 34 y.o.malewith medical history significant ofpolysubstance abuse including cocaine, marijuana, alcohol; well known to the hospital system just discharged last week (10/21/19) for event of toxic encephalopathy, was found down at home. Patient again found very somnolent overnight/early morning on the day of presentation by friends on couch and they were unable to wake him. EMS gave narcan with appropriate response.On reevaluation course, patient much more awake alert oriented states he snorted what he presumed to be cocaine overnight, subsequently awoke in the hospital with no memory of any events in between. ED Course:In ED patient was somewhat more awake after Narcan,patient's labs were remarkable for elevated CPK, with markedly elevated AST at 3000 ALT and moderately elevated ALT 400 respectively. UDS remarkable for cocaine, THC, barbiturates.  Patient was then admitted to the stepdown unit for further evaluation and treatment for possible shock liver/acute liver failure.  Hospital Course:   Following conditions were addressed  during hospitalization as listed below,  Transaminitis with confusion.  Normal INR.  Could be secondary to polypharmacy, substance abuse including alcohol abuse.   Liver function tests are trending down.  Mentation back to baseline.    LFTs trended down with AST of 143 and ALT of 227.   Patient was put on CIWA protocol.   Ultra sound of the right upper quadrant showed normal findings.. Salicylate and Tylenol levels were low.  Hepatitis panel was negative.   Patient was was extensively counseled regarding alcohol cessation and substance abuse.  Resources for outpatient treatment program were given to the patient.  Acute toxic encephalopathy Likely secondary to polysubstance abuse and polypharmacy.  Improved mentation at this time.  Unlikely hepatic encephalopathy.    Mentation is at his baseline prior to discharge  Polysubstance abuse/alcohol abuse. Was on on CIWA protocol.Resources for outpatient treatment program were given to the patient.  Disposition.  At this time, patient is stable for disposition home.  He was encouraged to follow-up with his primary care provider as outpatient.  Medical Consultants:    None.  Procedures:    None Subjective:   Today, patient is okay.  Denies any hallucinations, confusion or withdrawal symptoms.  Feels stable.  Discharge Exam:   Vitals:   10/31/19 0452 10/31/19 0906  BP: 122/80 129/76  Pulse: 60 77  Resp: 18 18  Temp: 98.2 F (36.8 C) 98.1 F (36.7 C)  SpO2: 100% 100%   Vitals:   10/30/19 1414 10/30/19 2014 10/31/19 0452 10/31/19 0906  BP: 120/84 135/82 122/80 129/76  Pulse: 68 75 60 77  Resp: 16 18 18 18   Temp: 98 F (36.7 C) 99.4 F (37.4 C) 98.2 F (36.8 C) 98.1 F (36.7 C)  TempSrc: Oral Oral Oral   SpO2:  98% 100% 100%  Weight:   61.2 kg   Height:   5\' 8"  (1.727 m)     General: Alert awake, not in obvious distress, communicative HENT: pupils equally reacting to light,  No scleral pallor or icterus noted. Oral mucosa  is moist.  Chest:  Clear breath sounds.  Diminished breath sounds bilaterally. No crackles or wheezes.  CVS: S1 &S2 heard. No murmur.  Regular rate and rhythm. Abdomen: Soft, nontender, nondistended.  Bowel sounds are heard.   Extremities: No cyanosis, clubbing or edema.  Peripheral pulses are palpable. Psych: Alert, awake and oriented, normal mood CNS:  No cranial nerve deficits.  Power equal in all extremities.   Skin: Warm and dry.  No rashes noted.  The results of significant diagnostics from this hospitalization (including imaging, microbiology, ancillary and laboratory) are listed below for reference.     Diagnostic Studies:   Abdomen Limited RUQ  Result Date: 10/28/2019 CLINICAL DATA:  Elevated liver function test. EXAM: ULTRASOUND ABDOMEN LIMITED RIGHT UPPER QUADRANT COMPARISON:  None. FINDINGS: Gallbladder: No gallstones or wall thickening visualized. No sonographic Murphy sign noted by sonographer. Common bile duct: Diameter: 3 mm Liver: No focal lesion identified. Within normal limits in parenchymal echogenicity. Portal vein is patent on color Doppler imaging with normal direction of blood flow towards the liver. Other: None. IMPRESSION: Normal right upper quadrant ultrasound. Electronically Signed   By: 12/28/2019 M.D.   On: 10/28/2019 08:24     Labs:   Basic Metabolic Panel: Recent Labs  Lab 10/28/19 0602 10/28/19 0602 10/29/19 0403 10/29/19 0403 10/30/19 0501 10/31/19 0600  NA 137  --  137  --  134* 138  K 4.1   < > 4.0   < > 3.7 3.9  CL 98  --  102  --  96* 99  CO2 22  --  24  --  25 27  GLUCOSE 58*  --  100*  --  103* 92  BUN 11  --  9  --  8 9  CREATININE 1.34*  --  0.95  --  0.90 0.76  CALCIUM 9.1  --  8.8*  --  9.3 10.0  MG  --   --   --   --  1.9 2.0  PHOS  --   --   --   --  3.6  --    < > = values in this interval not displayed.   GFR Estimated Creatinine Clearance: 112.6 mL/min (by C-G formula based on SCr of 0.76 mg/dL). Liver Function  Tests: Recent Labs  Lab 10/28/19 0602 10/29/19 0403 10/30/19 0501 10/31/19 0600  AST 2,912* 353* 214* 143*  ALT 422* 222* 236* 227*  ALKPHOS 144* 93 91 95  BILITOT 0.6 0.6 0.7 0.6  PROT 8.5* 6.6 7.0 7.6  ALBUMIN 4.7 3.6 3.8 4.1   Recent Labs  Lab 10/28/19 0930  LIPASE 37   No results for input(s): AMMONIA in the last 168 hours. Coagulation profile Recent Labs  Lab 10/28/19 1036  INR 1.0    CBC: Recent Labs  Lab 10/28/19 0602 10/29/19 0403 10/31/19 0600  WBC 19.8* 6.7 6.5  NEUTROABS 17.4*  --   --   HGB 13.8 11.9* 13.9  HCT 42.4 37.1* 42.3  MCV 98.1 96.6 95.5  PLT 419* 353 393   Cardiac Enzymes: Recent Labs  Lab 10/28/19 0930 10/30/19 0501  CKTOTAL 401* 322   BNP: Invalid input(s): POCBNP  CBG: Recent Labs  Lab 10/28/19 0552 10/28/19 0727 10/28/19 0826 10/28/19 0939  GLUCAP 72 59* 195* 104*   D-Dimer No results for input(s): DDIMER in the last 72 hours. Hgb A1c No results for input(s): HGBA1C in the last 72 hours. Lipid Profile No results for input(s): CHOL, HDL, LDLCALC, TRIG, CHOLHDL, LDLDIRECT in the last 72 hours. Thyroid function studies No results for input(s): TSH, T4TOTAL, T3FREE, THYROIDAB in the last 72 hours.  Invalid input(s): FREET3 Anemia work up No results for input(s): VITAMINB12, FOLATE, FERRITIN, TIBC, IRON, RETICCTPCT in the last 72 hours. Microbiology Recent Results (from the past 240 hour(s))  SARS Coronavirus 2 by RT PCR (hospital order, performed in [facility] hospital lab) Nasopharyngeal Nasopharyngeal Swab     Status: None   Collection Time: 10/28/19  9:30 AM   Specimen: Nasopharyngeal Swab  Result Value Ref Range Status   SARS Coronavirus 2 NEGATIVE NEGATIVE Final    Comment: (NOTE) SARS-CoV-2 target nucleic acids are NOT DETECTED. The SARS-CoV-2 RNA is generally detectable in upper and lower respiratory specimens during the acute phase of infection. The lowest concentration of SARS-CoV-2 viral copies this assay  can detect is 250 copies / mL. A negative result does not preclude SARS-CoV-2 infection and should not be used as the sole basis for treatment or other patient management decisions.  A negative result may occur with improper specimen collection / handling, submission of specimen other than nasopharyngeal swab, presence of viral mutation(s) within the areas targeted by this assay, and inadequate number of viral copies (<250 copies / mL). A negative result must be combined with clinical observations, patient history, and epidemiological information. Fact Sheet for Patients:   [employer] Fact Sheet for Healthcare Providers: https://pope.com/ This test is not yet approved or cleared  by the [place] FDA and has been authorized for detection and/or diagnosis of SARS-CoV-2 by FDA under an Emergency Use Authorization (EUA).  This EUA will remain in effect (meaning this test can be used) for the duration of the COVID-19 declaration under Section 564(b)(1) of the Act, 21 U.S.C. section 360bbb-3(b)(1), unless the authorization is terminated or revoked sooner. Performed at [facility], [address], [place], [place] [address]      Discharge Instructions:   Discharge Instructions    Diet general   Complete by: As directed    Discharge instructions   Complete by: As directed    Please do not take alcohol or excessive Tylenol.  Please consider outpatient rehabilitation options.  Follow-up with your primary care physician in 1 week to check your liver function.  Drinking alcohol or using other substances might damage your liver.   Increase activity slowly   Complete by: As directed      Allergies as of 10/31/2019   No Known Allergies     Medication List    STOP taking these medications   acetaminophen 500 MG tablet Commonly known as: TYLENOL   amoxicillin-clavulanate 875-125 MG tablet Commonly known as:  AUGMENTIN     TAKE these medications   folic acid 1 MG tablet Commonly known as: FOLVITE Take 1 tablet (1 mg total) by mouth daily.   multivitamin with minerals Tabs tablet Take 1 tablet by mouth daily.   thiamine 100 MG tablet Take 1 tablet (100 mg total) by mouth daily.      Follow-up Information    [facility] Follow up on 11/07/2019.   Why: @ 9:30 am for hospital follow up appt  Contact information: [place] [address] [phone]           Time coordinating discharge: 39 minutes  Signed:  Shefali Ng  [facility] 10/31/2019, 9:37 AM

## 2019-10-31 NOTE — Plan of Care (Signed)
Instructions were reviewed with patient. All questions were answered. Patient was transported to main entrance by wheelchair. ° °

## 2019-10-31 NOTE — TOC Transition Note (Signed)
Transition of Care [facility]) - CM/SW Discharge Note   Patient Details  Name: Kenneth Mccann MRN: [id number] Date of Birth: 11-18-1985  Transition [facility]) CM/SW Contact:  Lennart Pall, LCSW Phone Number: 10/31/2019, 9:49 AM   Clinical Narrative:   Met with pt this morning and explained that I have received denials from the inpatient SA programs.  Referred him back to written info I provided yesterday for local outpatient programs.  He is accepting of this and will return to friends home locally.  No further needs.    Final next level of care: Home/Self Care Barriers to Discharge: Barriers Resolved   Patient Goals and CMS Choice Patient states their goals for this hospitalization and ongoing recovery are:: Pt is hoping to go to inpatient SA rehab      Discharge Placement                       Discharge Plan and Services In-house Referral: Clinical Social Work              DME Arranged: N/A DME Agency: NA       HH Arranged: NA HH Agency: NA        Social Determinants of Health (SDOH) Interventions     Readmission Risk Interventions No flowsheet data found.

## 2019-11-07 ENCOUNTER — Inpatient Hospital Stay (INDEPENDENT_AMBULATORY_CARE_PROVIDER_SITE_OTHER): Payer: Self-pay | Admitting: Primary Care

## 2020-06-15 ENCOUNTER — Emergency Department (HOSPITAL_COMMUNITY)
Admission: EM | Admit: 2020-06-15 | Discharge: 2020-06-16 | Disposition: A | Discharge status: Home / Self Care | Payer: Self-pay | Attending: Emergency Medicine | Admitting: Emergency Medicine

## 2020-06-15 ENCOUNTER — Emergency Department (HOSPITAL_COMMUNITY): Payer: Self-pay

## 2020-06-15 ENCOUNTER — Encounter (HOSPITAL_COMMUNITY): Payer: Self-pay

## 2020-06-15 DIAGNOSIS — R5383 Other fatigue: Secondary | ICD-10-CM | POA: Insufficient documentation

## 2020-06-15 DIAGNOSIS — R079 Chest pain, unspecified: Secondary | ICD-10-CM | POA: Insufficient documentation

## 2020-06-15 DIAGNOSIS — R42 Dizziness and giddiness: Secondary | ICD-10-CM | POA: Insufficient documentation

## 2020-06-15 DIAGNOSIS — R569 Unspecified convulsions: Secondary | ICD-10-CM | POA: Insufficient documentation

## 2020-06-15 LAB — TROPONIN I (HIGH SENSITIVITY): Troponin I (High Sensitivity): 5 ng/L (ref <18)

## 2020-06-15 LAB — CBC
HCT: 38.2 % — ABNORMAL LOW (ref 39.0–52.0)
Hemoglobin: 13.1 g/dL (ref 13.0–17.0)
MCH: 33.2 pg (ref 26.0–34.0)
MCHC: 34.3 g/dL (ref 30.0–36.0)
MCV: 96.7 fL (ref 80.0–100.0)
Platelets: 272 10*3/uL (ref 150–400)
RBC: 3.95 MIL/uL — ABNORMAL LOW (ref 4.22–5.81)
RDW: 14 % (ref 11.5–15.5)
WBC: 4.9 10*3/uL (ref 4.0–10.5)
nRBC: 0 % (ref 0.0–0.2)

## 2020-06-15 LAB — BASIC METABOLIC PANEL
Anion gap: 12 (ref 5–15)
BUN: 14 mg/dL (ref 6–20)
CO2: 23 mmol/L (ref 22–32)
Calcium: 8.9 mg/dL (ref 8.9–10.3)
Chloride: 104 mmol/L (ref 98–111)
Creatinine, Ser: 0.97 mg/dL (ref 0.61–1.24)
GFR, Estimated: >60 mL/min (ref >60)
Glucose, Bld: 92 mg/dL (ref 70–99)
Potassium: 4 mmol/L (ref 3.5–5.1)
Sodium: 139 mmol/L (ref 135–145)

## 2020-06-15 NOTE — ED Triage Notes (Signed)
Pt comes via [facility], today began to have headache and CP, report on Monday that he had a seizure, no hx of seizures, did not want to be seen on Monday.

## 2020-06-16 ENCOUNTER — Emergency Department (HOSPITAL_COMMUNITY): Payer: Self-pay

## 2020-06-16 ENCOUNTER — Other Ambulatory Visit: Payer: Self-pay

## 2020-06-16 LAB — TROPONIN I (HIGH SENSITIVITY): Troponin I (High Sensitivity): 5 ng/L (ref <18)

## 2020-06-16 MED ORDER — CHLORDIAZEPOXIDE HCL 25 MG PO CAPS
100.0000 mg | ORAL_CAPSULE | Freq: Once | ORAL | Status: AC
  Administered 2020-06-16: 100 mg via ORAL
  Filled 2020-06-16: qty 4

## 2020-06-16 MED ORDER — CHLORDIAZEPOXIDE HCL 25 MG PO CAPS: ORAL_CAPSULE | ORAL | 0 refills | Status: AC

## 2020-06-16 MED ORDER — LORAZEPAM 1 MG PO TABS
1.0000 mg | ORAL_TABLET | Freq: Once | ORAL | Status: AC
  Administered 2020-06-16: 1 mg via ORAL
  Filled 2020-06-16: qty 1

## 2020-06-16 NOTE — ED Notes (Signed)
Pt denies CP at this time 
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
## 2020-06-16 NOTE — Discharge Instructions (Signed)
If you had a seizure it could be due to you cutting back on your alcohol intake.  This means that you may need help coming off of alcohol or other illegal substances.  I have prescribed you a medication that works somewhat like alcohol but is unlikely to be addictive.  Please return for repeat episode if you started hallucinating.  I have attached a list of places that can help you stop drinking alcohol or using other substances.

## 2021-08-29 IMAGING — CT CT HEAD W/O CM
4 series · 17 of 47 positions shown, 19 images · non-contrast
Comparison: Brain CT 10/18/2019.

CLINICAL DATA: Altered mental status.  Headache.  Possible seizure.

EXAM:
CT HEAD WITHOUT CONTRAST
TECHNIQUE: Contiguous axial images were obtained from the base of the skull
through the vertex without intravenous contrast.

[Series 3: head without · axial · non-contrast · 0.41mm/px · z∈[-100,+20]mm · 7 of 33 slices shown, 9 images]
[im 5/33  brain]
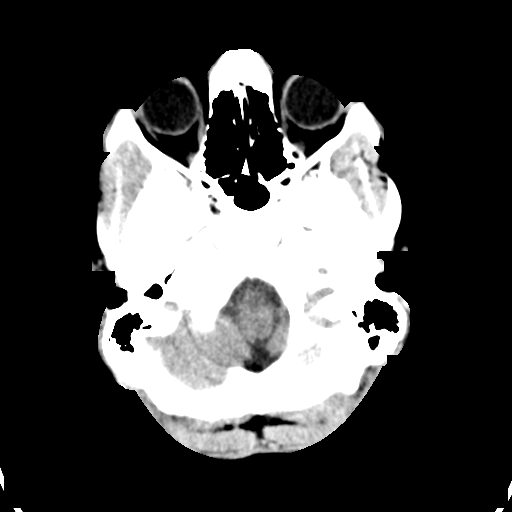
[im 5/33  bone]
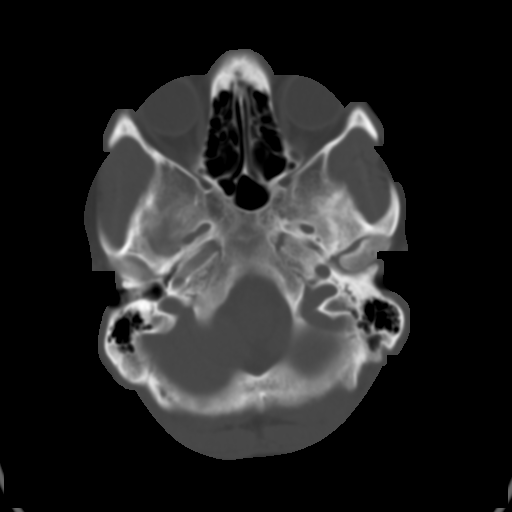
[im 9/33  brain]
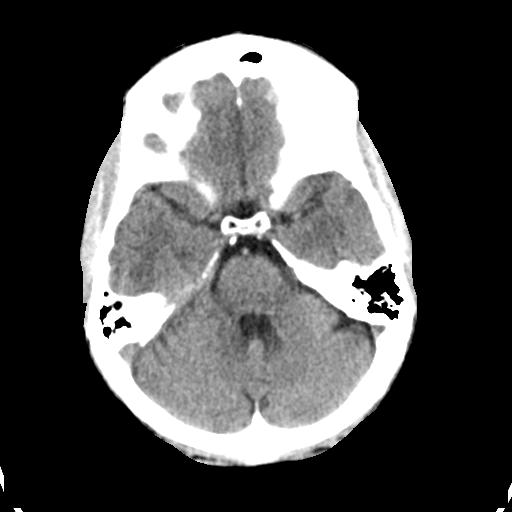
[im 13/33  brain]
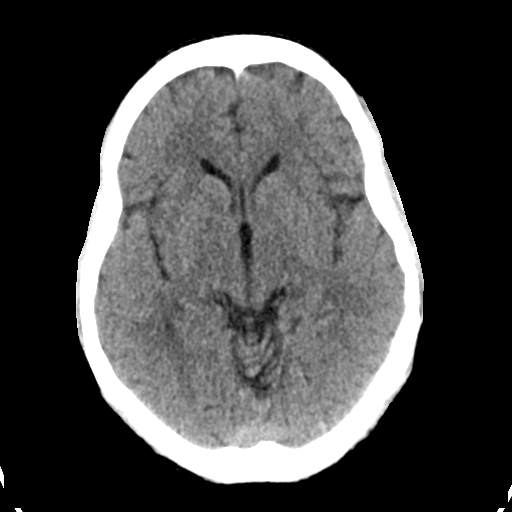
[im 17/33  brain]
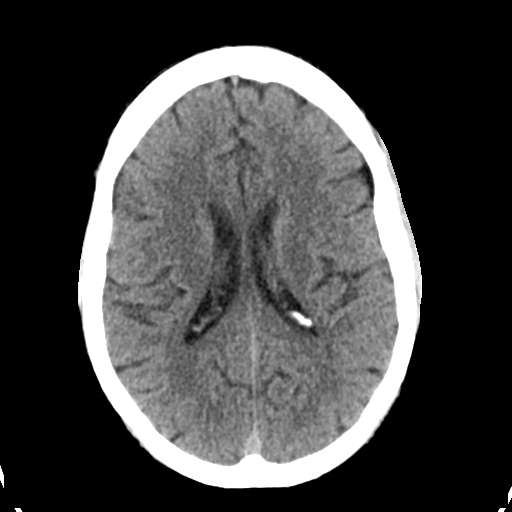
[im 21/33  brain]
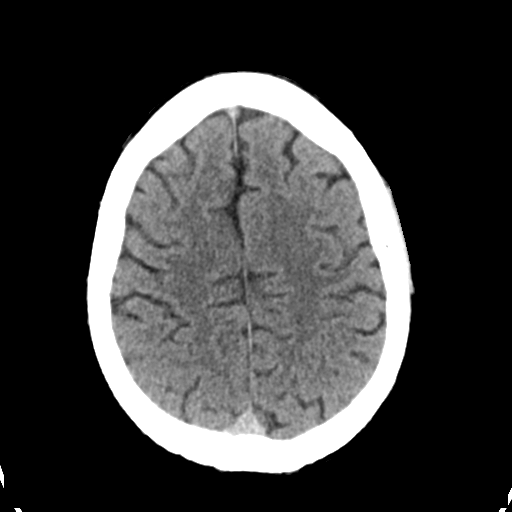
[im 21/33  bone]
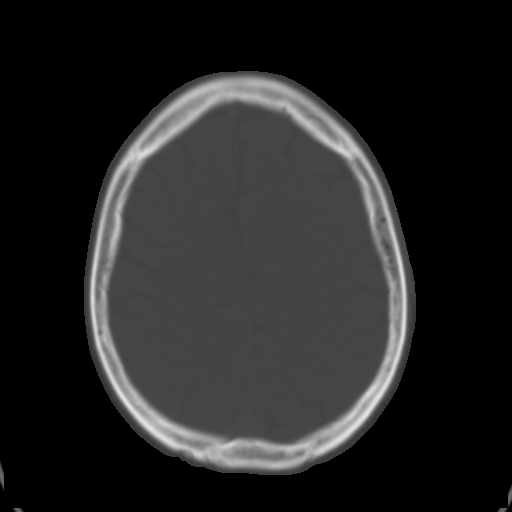
[im 25/33  brain]
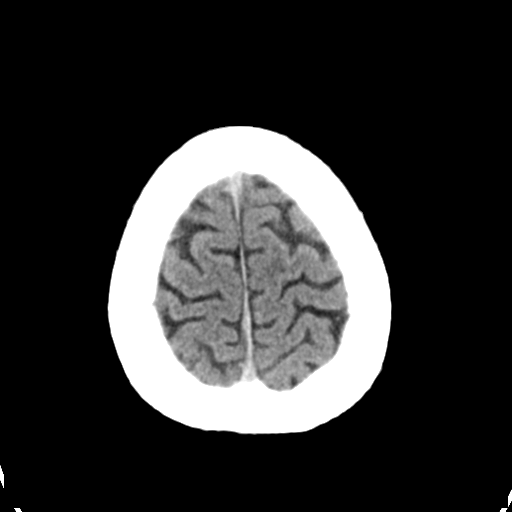
[im 29/33  brain]
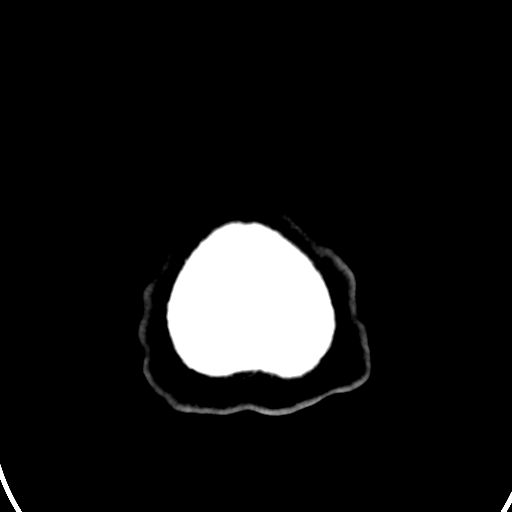

[Series 4: head bone · axial · 0.41mm/px · z∈[-104,-48]mm · 4 of 81 slices shown]
[im 9/81  bone]
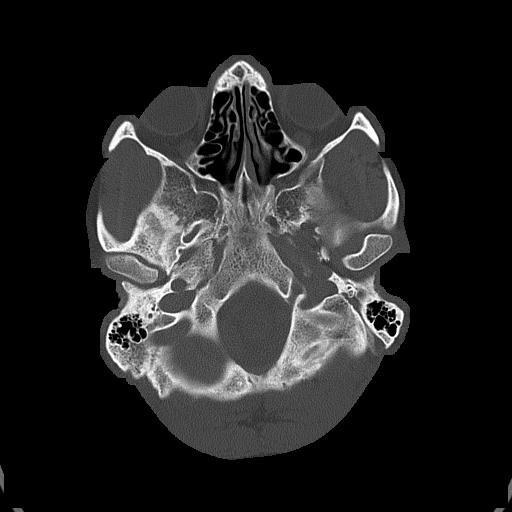
[im 17/81  bone]
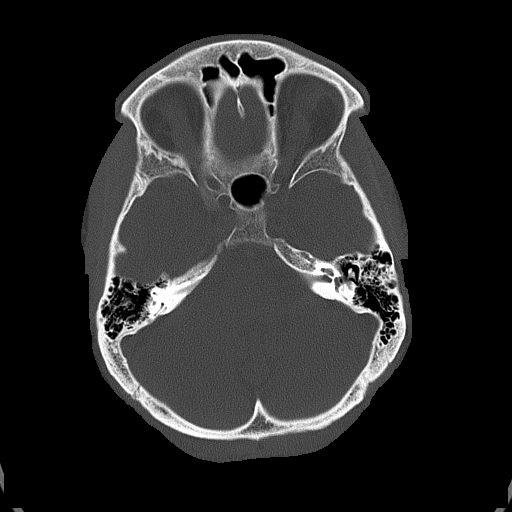
[im 25/81  bone]
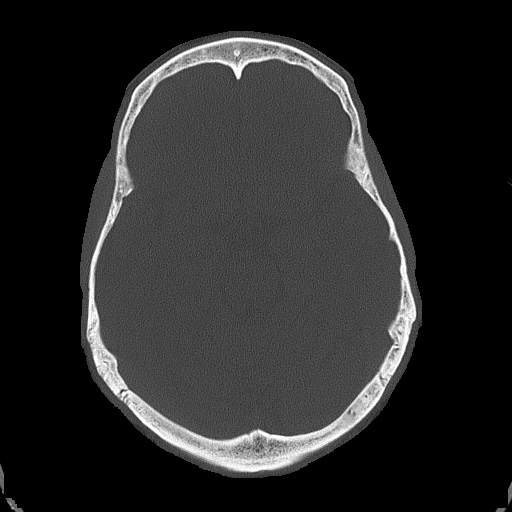
[im 37/81  bone]
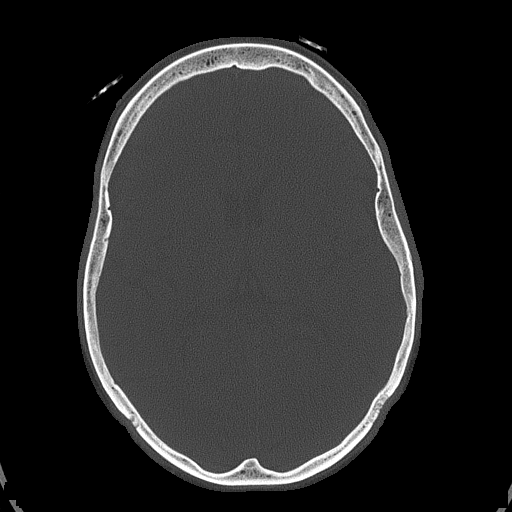

[Series 5: head without cor · coronal · non-contrast · 0.35mm/px · 3 of 68 slices shown]
[im 23/68  brain]
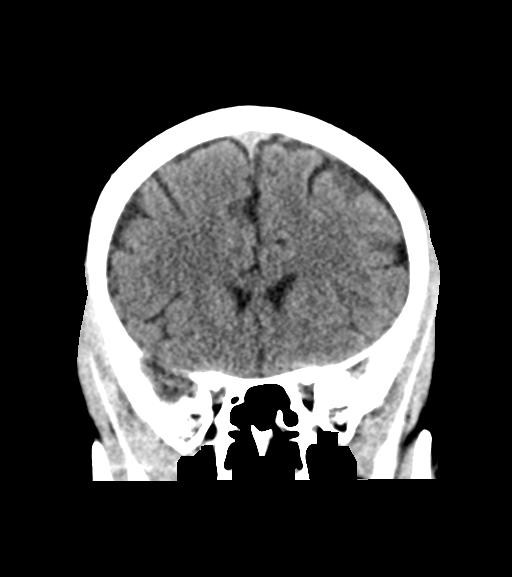
[im 30/68  brain]
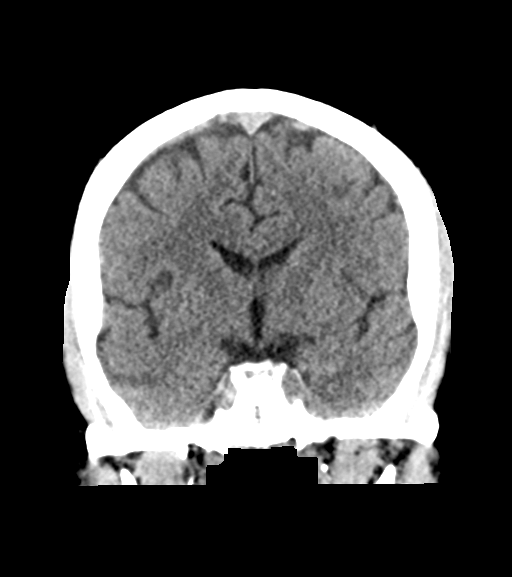
[im 38/68  brain]
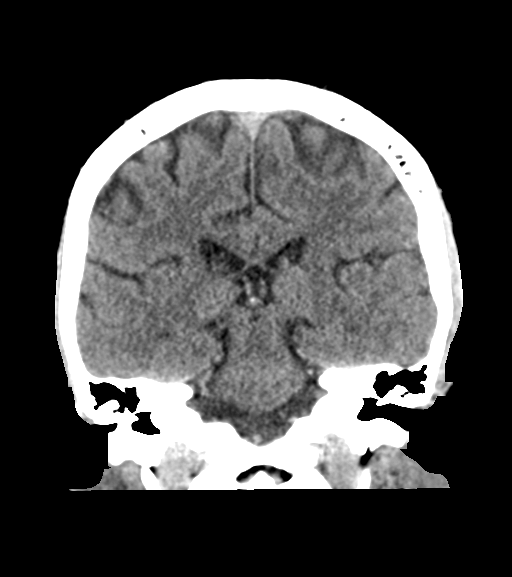

[Series 6: head without sag · sagittal · non-contrast · 0.40mm/px · 3 of 60 slices shown]
[im 20/60  brain]
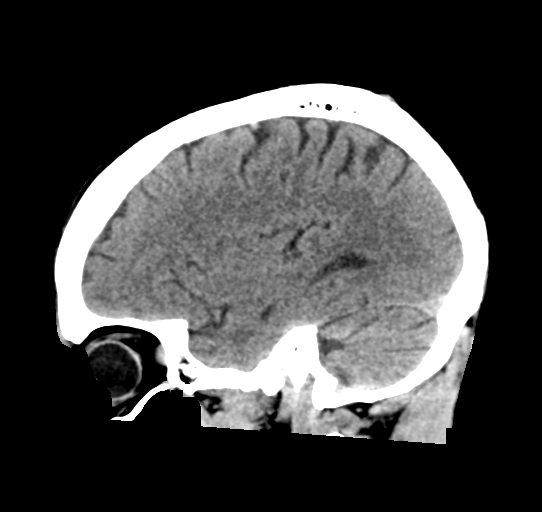
[im 30/60  brain]
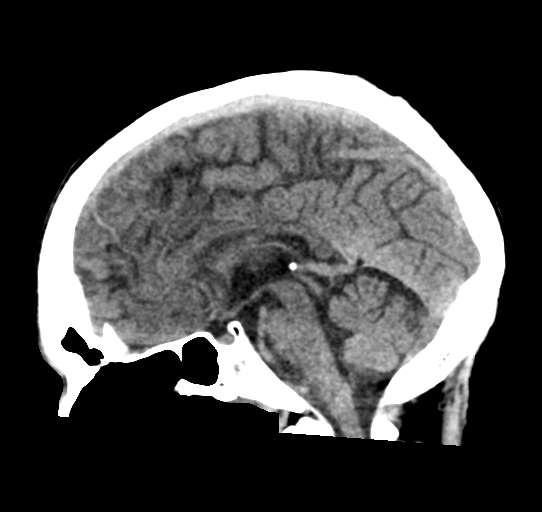
[im 40/60  brain]
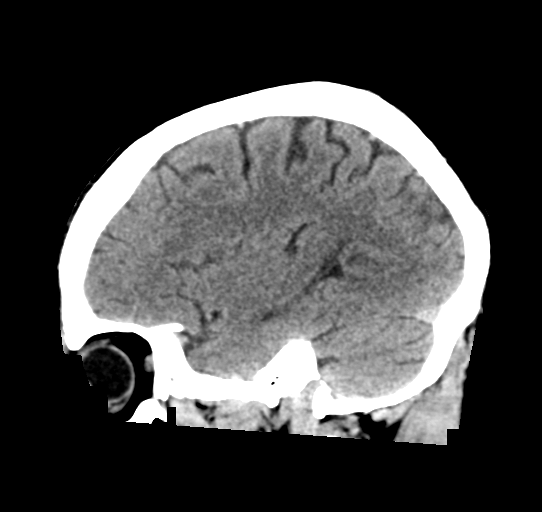

[17 of 47 positions shown; findings below may reference images not displayed]

FINDINGS: Brain: Ventricles and sulci are appropriate for patient's age. No
evidence for acute cortically based infarct, intracranial
hemorrhage, mass lesion or mass-effect.

Vascular: Unremarkable

Skull: Intact.

Sinuses/Orbits: Paranasal sinuses well aerated. Mastoid air cells
are unremarkable. Orbits are unremarkable.

Other: None.
IMPRESSION: No acute intracranial process.
